# Patient Record
Sex: Female | Born: 1975 | Race: Black or African American | Hispanic: No | Marital: Single | Smoking: Never smoker
Health system: Southern US, Community
[De-identification: ages and names within clinical notes are randomized; demographics above are authoritative.]

## PROBLEM LIST (undated history)

## (undated) DIAGNOSIS — K219 Gastro-esophageal reflux disease without esophagitis: Secondary | ICD-10-CM

## (undated) DIAGNOSIS — B20 Human immunodeficiency virus [HIV] disease: Secondary | ICD-10-CM

## (undated) HISTORY — PX: OVARIAN CYST SURGERY: SHX726

## (undated) HISTORY — PX: OOPHORECTOMY: SHX86

---

## 2014-03-11 ENCOUNTER — Ambulatory Visit: Payer: Self-pay | Admitting: Advanced Practice Midwife

## 2014-04-18 ENCOUNTER — Emergency Department (HOSPITAL_BASED_OUTPATIENT_CLINIC_OR_DEPARTMENT_OTHER): Payer: Medicaid Other

## 2014-04-18 ENCOUNTER — Encounter (HOSPITAL_BASED_OUTPATIENT_CLINIC_OR_DEPARTMENT_OTHER): Payer: Self-pay | Admitting: Emergency Medicine

## 2014-04-18 ENCOUNTER — Emergency Department (HOSPITAL_BASED_OUTPATIENT_CLINIC_OR_DEPARTMENT_OTHER)
Admission: EM | Admit: 2014-04-18 | Discharge: 2014-04-19 | Disposition: A | Discharge status: Home / Self Care | Payer: Medicaid Other | Attending: Emergency Medicine | Admitting: Emergency Medicine

## 2014-04-18 DIAGNOSIS — R143 Flatulence: Principal | ICD-10-CM

## 2014-04-18 DIAGNOSIS — Z79899 Other long term (current) drug therapy: Secondary | ICD-10-CM | POA: Diagnosis not present

## 2014-04-18 DIAGNOSIS — Z21 Asymptomatic human immunodeficiency virus [HIV] infection status: Secondary | ICD-10-CM | POA: Insufficient documentation

## 2014-04-18 DIAGNOSIS — R1084 Generalized abdominal pain: Secondary | ICD-10-CM | POA: Insufficient documentation

## 2014-04-18 DIAGNOSIS — R141 Gas pain: Secondary | ICD-10-CM | POA: Diagnosis not present

## 2014-04-18 DIAGNOSIS — Z3202 Encounter for pregnancy test, result negative: Secondary | ICD-10-CM | POA: Insufficient documentation

## 2014-04-18 DIAGNOSIS — K219 Gastro-esophageal reflux disease without esophagitis: Secondary | ICD-10-CM | POA: Insufficient documentation

## 2014-04-18 DIAGNOSIS — R142 Eructation: Principal | ICD-10-CM

## 2014-04-18 DIAGNOSIS — IMO0001 Reserved for inherently not codable concepts without codable children: Secondary | ICD-10-CM

## 2014-04-18 HISTORY — DX: Gastro-esophageal reflux disease without esophagitis: K21.9

## 2014-04-18 HISTORY — DX: Human immunodeficiency virus (HIV) disease: B20

## 2014-04-18 LAB — URINALYSIS, ROUTINE W REFLEX MICROSCOPIC
Bilirubin Urine: NEGATIVE
Glucose, UA: NEGATIVE mg/dL
HGB URINE DIPSTICK: NEGATIVE
Ketones, ur: NEGATIVE mg/dL
Leukocytes, UA: NEGATIVE
Nitrite: NEGATIVE
Protein, ur: NEGATIVE mg/dL
SPECIFIC GRAVITY, URINE: 1.027 (ref 1.005–1.030)
UROBILINOGEN UA: 0.2 mg/dL (ref 0.0–1.0)
pH: 5.5 (ref 5.0–8.0)

## 2014-04-18 LAB — PREGNANCY, URINE: PREG TEST UR: NEGATIVE

## 2014-04-18 MED ORDER — DICYCLOMINE HCL 10 MG PO CAPS
10.0000 mg | ORAL_CAPSULE | Freq: Once | ORAL | Status: AC
  Administered 2014-04-18: 10 mg via ORAL
  Filled 2014-04-18: qty 1

## 2014-04-18 MED ORDER — GI COCKTAIL ~~LOC~~
30.0000 mL | Freq: Once | ORAL | Status: AC
  Administered 2014-04-18: 30 mL via ORAL
  Filled 2014-04-18: qty 30

## 2014-04-18 NOTE — ED Notes (Signed)
Patient transported to X-ray 

## 2014-04-18 NOTE — ED Notes (Signed)
Pt states unable to [place] kit for ED WR

## 2014-04-18 NOTE — ED Notes (Signed)
MD at bedside. 

## 2014-04-18 NOTE — ED Provider Notes (Signed)
CSN: [id number]     Arrival date & time 04/18/14  2133 History  This chart was scribed for Hanne Kegg Smitty Cords, MD by Charline Bills, ED Scribe. The patient was seen in room MH05/MH05. Patient's care was started at 11:08 PM.   Chief Complaint  Patient presents with  . Abdominal Pain   Patient is a 38 y.o. female presenting with abdominal pain. The history is provided by the patient. No language interpreter was used.  Abdominal Pain Pain location:  Generalized Pain quality: bloating and cramping   Pain radiates to:  Does not radiate Pain severity:  Moderate Onset quality:  Sudden Duration:  1 hour Timing:  Intermittent Progression:  Worsening Chronicity:  New Context: eating   Context comment:  Mac and chees with Pinto beans within an hour or so of start Relieved by:  Nothing Worsened by:  Nothing tried Ineffective treatments:  None tried Associated symptoms: belching and flatus   Associated symptoms: no chest pain, no chills, no constipation, no diarrhea, no dysuria, no fever, no hematuria, no nausea, no shortness of breath, no vaginal bleeding and no vomiting   Risk factors: no alcohol abuse    HPI Comments: Brandy Patton is a 38 y.o. female, with a h/o GERD and HIV, who presents to the Emergency Department complaining of intermittent lower abdominal pain onset 1 hour PTA. Pt states that it "feels like trapped gas". Pt states that she ate pinto beans and macaroni and cheese prior to pain. She denies diarrhea, constipation, hematuria, dysuria.   Past Medical History  Diagnosis Date  . GERD (gastroesophageal reflux disease)   . HIV (human immunodeficiency virus infection)    Past Surgical History  Procedure Laterality Date  . Oophorectomy    . Ovarian cyst surgery     No family history on file. History  Substance Use Topics  . Smoking status: Never Smoker   . Smokeless tobacco: Not on file  . Alcohol Use: No   OB History   Grav Para Term Preterm Abortions TAB SAB Ect  Mult Living                 Review of Systems  Constitutional: Negative for fever and chills.  Respiratory: Negative for shortness of breath.   Cardiovascular: Negative for chest pain.  Gastrointestinal: Positive for abdominal pain and flatus. Negative for nausea, vomiting, diarrhea and constipation.  Genitourinary: Negative for dysuria, hematuria and vaginal bleeding.  All other systems reviewed and are negative.  Allergies  Review of patient's allergies indicates no known allergies.  Home Medications   Prior to Admission medications   Medication Sig Start Date End Date Taking? Authorizing Provider  AMITRIPTYLINE HCL PO Take by mouth.   Yes Historical Provider, MD  Emtricitabine-Tenofovir (TRUVADA PO) Take by mouth.   Yes Historical Provider, MD  OMEPRAZOLE PO Take by mouth.   Yes Historical Provider, MD  raltegravir (ISENTRESS) 400 MG tablet Take 400 mg by mouth 2 (two) times daily.   Yes Historical Provider, MD  SERTRALINE HCL PO Take by mouth.   Yes Historical Provider, MD   Triage Vitals: BP 135/90  Pulse 84  Temp(Src) 98.3 F (36.8 C) (Oral)  Resp 22  Ht  (1.575 m)  Wt 240 lb (108.863 kg)  BMI 43.89 kg/m2  SpO2 99%  LMP 03/27/2014 Physical Exam  Nursing note and vitals reviewed. Constitutional: She is oriented to person, place, and time. She appears well-developed and well-nourished. No distress.  HENT:  Head: Normocephalic and atraumatic.  Mouth/Throat: Oropharynx is clear and moist.  Eyes: Conjunctivae and EOM are normal. Pupils are equal, round, and reactive to light.  Neck: Normal range of motion. Neck supple. No tracheal deviation present.  Cardiovascular: Normal rate and regular rhythm.   Pulmonary/Chest: Effort normal and breath sounds normal. No respiratory distress. She has no wheezes. She has no rales.  Abdominal: Soft. She exhibits distension. Bowel sounds are increased. There is no tenderness. There is no rigidity, no rebound, no guarding, no  tenderness at McBurney's point and negative Murphy's sign.  Audible gas without stethoscope  Musculoskeletal: Normal range of motion.  Neurological: She is alert and oriented to person, place, and time.  Skin: Skin is warm and dry.  Psychiatric: She has a normal mood and affect. Her behavior is normal.   ED Course  Procedures (including critical care time) DIAGNOSTIC STUDIES: Oxygen Saturation is 99% on RA, normal by my interpretation.    COORDINATION OF CARE: 11:13 PM-Discussed treatment plan which includes UA with pt at bedside and pt agreed to plan.   Labs Review Labs Reviewed  URINALYSIS, ROUTINE W REFLEX MICROSCOPIC - Abnormal; Notable for the following:    APPearance CLOUDY (*)    All other components within normal limits  PREGNANCY, URINE   Imaging Review Dg Abd Acute W/chest  04/18/2014   CLINICAL DATA:  Stomach pain after eating pinto beans this evening.  EXAM: ACUTE ABDOMEN SERIES (ABDOMEN 2 VIEW & CHEST 1 VIEW)  COMPARISON:  None.  FINDINGS: Normal heart size and pulmonary vascularity. No focal airspace disease or consolidation in the lungs. No blunting of costophrenic angles. No pneumothorax. Mediastinal contours appear intact.  Gas and stool throughout the colon. Gas with and left upper quadrant small bowel. No small or large bowel distention. No free intra-abdominal air. No abnormal air-fluid levels. No radiopaque stones. Degenerative changes in the lumbar spine and hips.  IMPRESSION: No evidence of active pulmonary disease. Nonobstructive bowel gas pattern.   Electronically Signed   By: Burman Nieves M.D.   On: 04/18/2014 23:44    EKG Interpretation None      MDM   Final diagnoses:  None   Toradol injection Bentyl and GI cocktail given with resolution of belching and upper abdominal discomfort.  Recommend  glycerin suppository to stimulate movement from below and staying away from foods that can can cause gas such as beans and follow-up with family physician.     I personally performed the services described in this documentation, which was scribed in my presence. The recorded information has been reviewed and is accurate.    Jasmine Awe, MD 04/19/14 [id number]

## 2014-04-18 NOTE — ED Notes (Signed)
C/o abd pain x 1 hour-"feels like trapped gas"-pt states she was taken to [facility] ED via EMS-states she did left due to being placed in ED lobby with no privacy

## 2014-04-19 ENCOUNTER — Encounter (HOSPITAL_BASED_OUTPATIENT_CLINIC_OR_DEPARTMENT_OTHER): Payer: Self-pay | Admitting: Emergency Medicine

## 2014-04-19 MED ORDER — KETOROLAC TROMETHAMINE 60 MG/2ML IM SOLN
60.0000 mg | Freq: Once | INTRAMUSCULAR | Status: AC
  Administered 2014-04-19: 60 mg via INTRAMUSCULAR

## 2014-04-19 MED ORDER — DICYCLOMINE HCL 20 MG PO TABS: 20.0000 mg | ORAL_TABLET | Freq: Two times a day (BID) | ORAL | Status: AC

## 2014-04-19 MED ORDER — KETOROLAC TROMETHAMINE 60 MG/2ML IM SOLN
INTRAMUSCULAR | Status: AC
  Filled 2014-04-19: qty 2

## 2014-04-19 NOTE — Discharge Instructions (Signed)
Bloating Bloating is the feeling of fullness in your belly. You may feel as though your pants are too tight. Often the cause of bloating is overeating, retaining fluids, or having gas in your bowel. It is also caused by swallowing air and eating foods that cause gas. Irritable bowel syndrome is one of the most common causes of bloating. Constipation is also a common cause. Sometimes more serious problems can cause bloating. SYMPTOMS  Usually there is a feeling of fullness, as though your abdomen is bulged out. There may be mild discomfort.  DIAGNOSIS  Usually no particular testing is necessary for most bloating. If the condition persists and seems to become worse, your caregiver may do additional testing.  TREATMENT   There is no direct treatment for bloating.  Do not put gas into the bowel. Avoid chewing gum and sucking on candy. These tend to make you swallow air. Swallowing air can also be a nervous habit. Try to avoid this.  Avoiding high residue diets will help. Eat foods with soluble fibers (examples include root vegetables, apples, or barley) and substitute dairy products with soy and rice products. This helps irritable bowel syndrome.  If constipation is the cause, then a high residue diet with more fiber will help.  Avoid carbonated beverages.  Over-the-counter preparations are available that help reduce gas. Your pharmacist can help you with this. SEEK MEDICAL CARE IF:   Bloating continues and seems to be getting worse.  You notice a weight gain.  You have a weight loss but the bloating is getting worse.  You have changes in your bowel habits or develop nausea or vomiting. SEEK IMMEDIATE MEDICAL CARE IF:   You develop shortness of breath or swelling in your legs.  You have an increase in abdominal pain or develop chest pain. Document Released: 06/12/2006 Document Revised: 11/04/2011 Document Reviewed: 07/31/2007 ExitCare Patient Information 2015 [employer]. This  information is not intended to replace advice given to you by your health care provider. Make sure you discuss any questions you have with your health care provider.  

## 2014-04-19 NOTE — ED Notes (Signed)
MD at bedside discussing test results and dispo plan of care. 

## 2014-05-03 ENCOUNTER — Ambulatory Visit: Payer: Self-pay | Admitting: Obstetrics

## 2014-06-21 ENCOUNTER — Ambulatory Visit: Payer: Self-pay | Admitting: Obstetrics

## 2015-09-23 IMAGING — CR DG ABDOMEN ACUTE W/ 1V CHEST
4 series · 4 of 4 positions shown · non-contrast
Comparison: None.

CLINICAL DATA: Stomach pain after eating Raffaele Merlos this evening.

EXAM:
ACUTE ABDOMEN SERIES (ABDOMEN 2 VIEW & CHEST 1 VIEW)

[w chest pa]
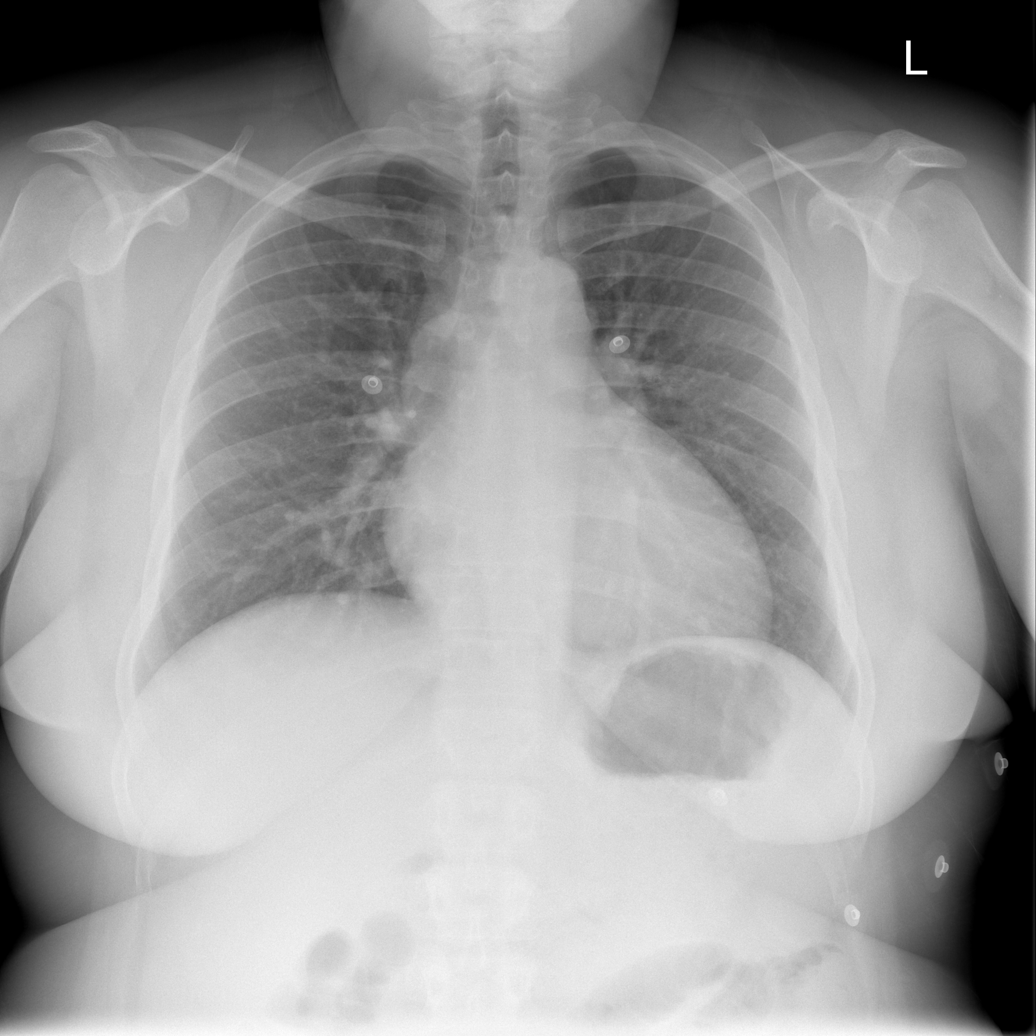

[w abdomen upright]
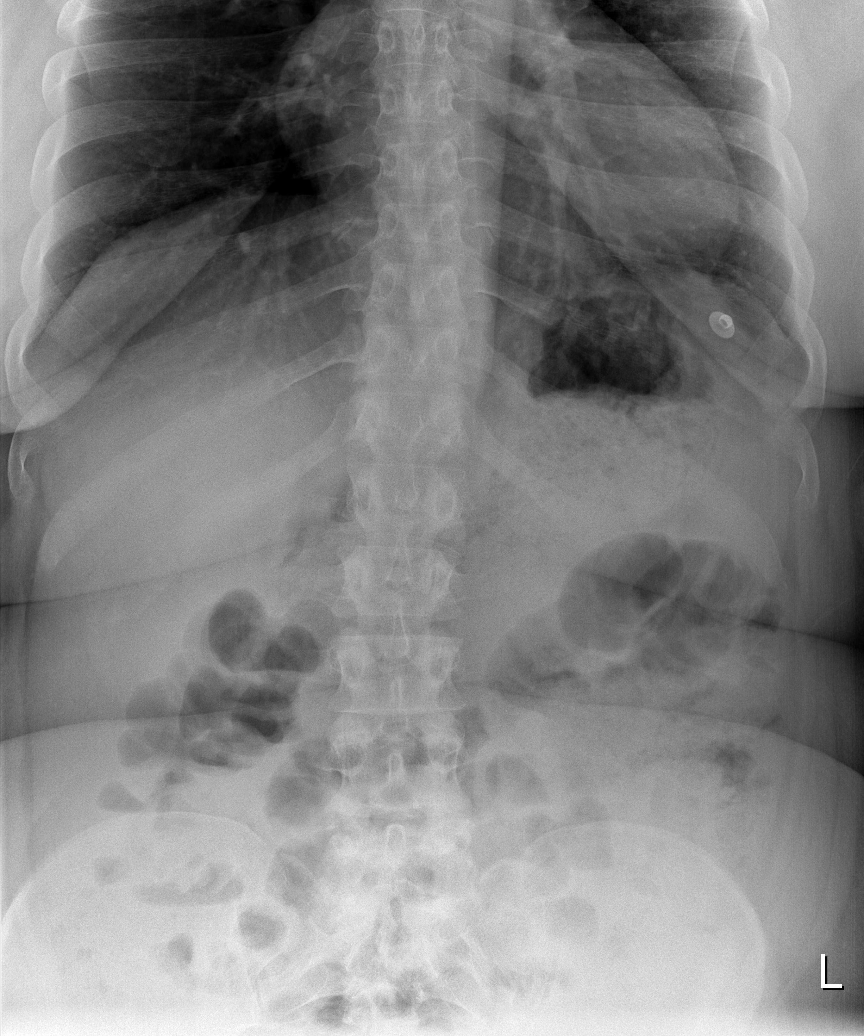

[t abdomen supine (1 of 2)]
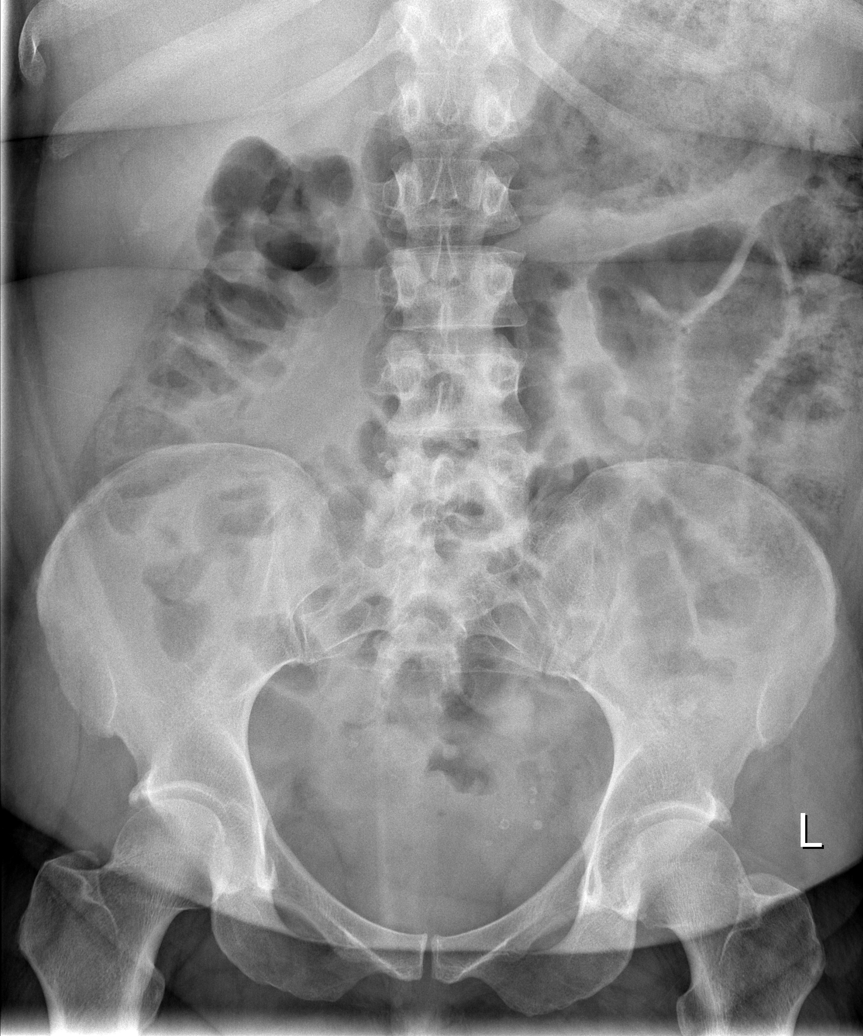

[t abdomen supine (2 of 2)]
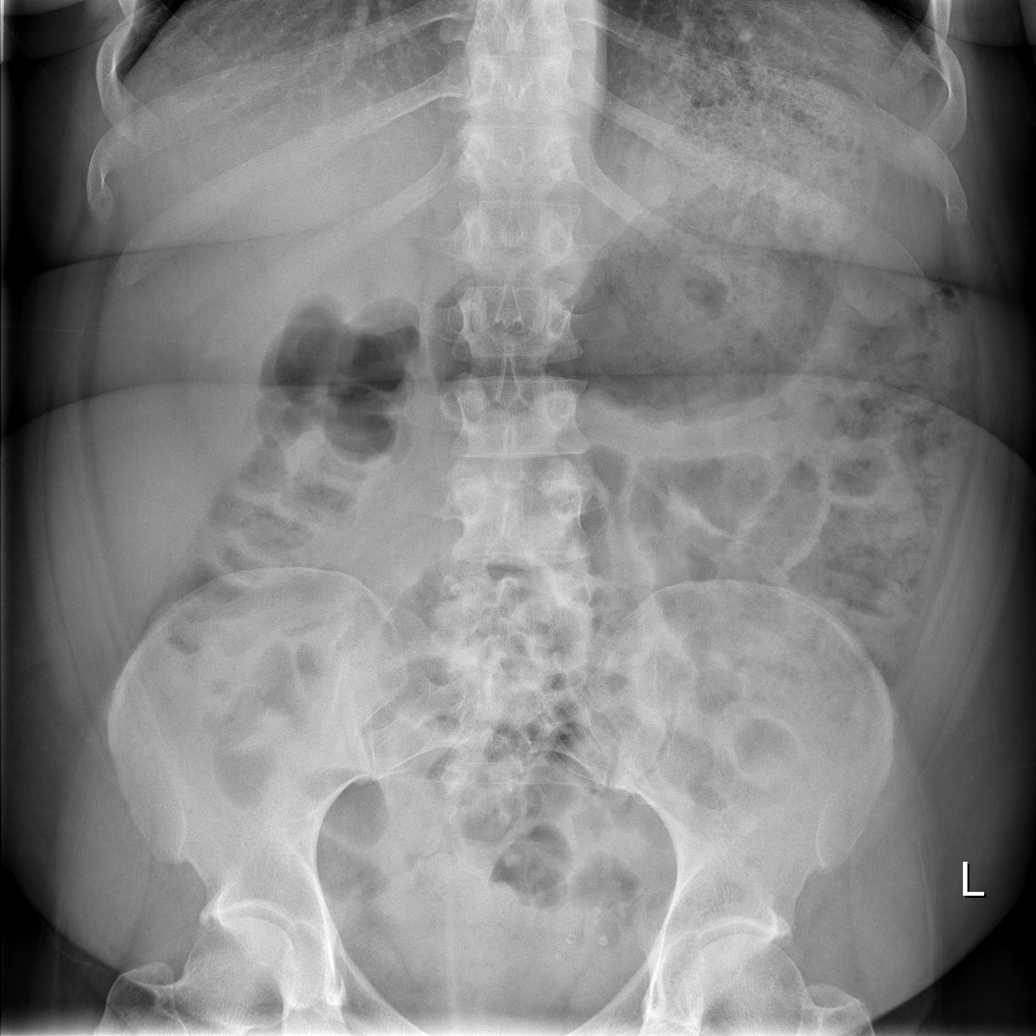

[4 of 4 positions shown; findings below may reference images not displayed]

FINDINGS: Normal heart size and pulmonary vascularity. No focal airspace
disease or consolidation in the lungs. No blunting of costophrenic
angles. No pneumothorax. Mediastinal contours appear intact.

Gas and stool throughout the colon. Gas with and left upper quadrant
small bowel. No small or large bowel distention. No free
intra-abdominal air. No abnormal air-fluid levels. No radiopaque
stones. Degenerative changes in the lumbar spine and hips.
IMPRESSION: No evidence of active pulmonary disease. Nonobstructive bowel gas
pattern.

## 2017-08-27 ENCOUNTER — Emergency Department (HOSPITAL_BASED_OUTPATIENT_CLINIC_OR_DEPARTMENT_OTHER)
Admission: EM | Admit: 2017-08-27 | Discharge: 2017-08-27 | Disposition: A | Discharge status: Home / Self Care | Payer: Medicaid Other | Attending: Emergency Medicine | Admitting: Emergency Medicine

## 2017-08-27 ENCOUNTER — Other Ambulatory Visit: Payer: Self-pay

## 2017-08-27 ENCOUNTER — Encounter (HOSPITAL_BASED_OUTPATIENT_CLINIC_OR_DEPARTMENT_OTHER): Payer: Self-pay | Admitting: Emergency Medicine

## 2017-08-27 DIAGNOSIS — A5901 Trichomonal vulvovaginitis: Secondary | ICD-10-CM | POA: Diagnosis not present

## 2017-08-27 DIAGNOSIS — A599 Trichomoniasis, unspecified: Secondary | ICD-10-CM

## 2017-08-27 DIAGNOSIS — Z21 Asymptomatic human immunodeficiency virus [HIV] infection status: Secondary | ICD-10-CM | POA: Insufficient documentation

## 2017-08-27 DIAGNOSIS — O98711 Human immunodeficiency virus [HIV] disease complicating pregnancy, first trimester: Secondary | ICD-10-CM | POA: Diagnosis not present

## 2017-08-27 DIAGNOSIS — O219 Vomiting of pregnancy, unspecified: Secondary | ICD-10-CM

## 2017-08-27 DIAGNOSIS — O218 Other vomiting complicating pregnancy: Secondary | ICD-10-CM | POA: Diagnosis not present

## 2017-08-27 DIAGNOSIS — Z3A12 12 weeks gestation of pregnancy: Secondary | ICD-10-CM | POA: Diagnosis not present

## 2017-08-27 DIAGNOSIS — O2310 Infections of bladder in pregnancy, unspecified trimester: Secondary | ICD-10-CM | POA: Insufficient documentation

## 2017-08-27 DIAGNOSIS — N3000 Acute cystitis without hematuria: Secondary | ICD-10-CM

## 2017-08-27 DIAGNOSIS — O98311 Other infections with a predominantly sexual mode of transmission complicating pregnancy, first trimester: Secondary | ICD-10-CM | POA: Diagnosis not present

## 2017-08-27 DIAGNOSIS — Z79899 Other long term (current) drug therapy: Secondary | ICD-10-CM | POA: Diagnosis not present

## 2017-08-27 LAB — URINALYSIS, ROUTINE W REFLEX MICROSCOPIC
BILIRUBIN URINE: NEGATIVE
Glucose, UA: NEGATIVE mg/dL
Ketones, ur: >80 mg/dL — AB
Nitrite: NEGATIVE
PH: 6 (ref 5.0–8.0)
Protein, ur: NEGATIVE mg/dL
SPECIFIC GRAVITY, URINE: 1.025 (ref 1.005–1.030)

## 2017-08-27 LAB — URINALYSIS, MICROSCOPIC (REFLEX)

## 2017-08-27 LAB — COMPREHENSIVE METABOLIC PANEL
ALK PHOS: 32 U/L — AB (ref 38–126)
ALT: 17 U/L (ref 14–54)
ANION GAP: 7 (ref 5–15)
AST: 15 U/L (ref 15–41)
Albumin: 3.4 g/dL — ABNORMAL LOW (ref 3.5–5.0)
BILIRUBIN TOTAL: 0.4 mg/dL (ref 0.3–1.2)
BUN: <5 mg/dL — ABNORMAL LOW (ref 6–20)
CALCIUM: 8.8 mg/dL — AB (ref 8.9–10.3)
CO2: 21 mmol/L — AB (ref 22–32)
CREATININE: 0.42 mg/dL — AB (ref 0.44–1.00)
Chloride: 104 mmol/L (ref 101–111)
GFR calc Af Amer: >60 mL/min (ref >60)
GFR calc non Af Amer: >60 mL/min (ref >60)
Glucose, Bld: 95 mg/dL (ref 65–99)
Potassium: 3.1 mmol/L — ABNORMAL LOW (ref 3.5–5.1)
SODIUM: 132 mmol/L — AB (ref 135–145)
Total Protein: 8.4 g/dL — ABNORMAL HIGH (ref 6.5–8.1)

## 2017-08-27 MED ORDER — NITROFURANTOIN MONOHYD MACRO 100 MG PO CAPS: 100.0000 mg | ORAL_CAPSULE | Freq: Two times a day (BID) | ORAL | 0 refills | Status: AC

## 2017-08-27 MED ORDER — SODIUM CHLORIDE 0.9 % IV BOLUS (SEPSIS)
1000.0000 mL | Freq: Once | INTRAVENOUS | Status: AC
  Administered 2017-08-27: 1000 mL via INTRAVENOUS

## 2017-08-27 MED ORDER — METOCLOPRAMIDE HCL 5 MG/ML IJ SOLN
5.0000 mg | Freq: Once | INTRAMUSCULAR | Status: AC
  Administered 2017-08-27: 5 mg via INTRAVENOUS
  Filled 2017-08-27: qty 2

## 2017-08-27 MED ORDER — DIPHENHYDRAMINE HCL 50 MG/ML IJ SOLN
12.5000 mg | Freq: Once | INTRAMUSCULAR | Status: AC
  Administered 2017-08-27: 12.5 mg via INTRAVENOUS
  Filled 2017-08-27: qty 1

## 2017-08-27 MED ORDER — NITROFURANTOIN MONOHYD MACRO 100 MG PO CAPS
100.0000 mg | ORAL_CAPSULE | Freq: Once | ORAL | Status: AC
  Administered 2017-08-27: 100 mg via ORAL
  Filled 2017-08-27: qty 1

## 2017-08-27 MED ORDER — DOXYLAMINE-PYRIDOXINE 10-10 MG PO TBEC: 2.0000 | DELAYED_RELEASE_TABLET | Freq: Every day | ORAL | 0 refills | Status: AC

## 2017-08-27 MED ORDER — METRONIDAZOLE 500 MG PO TABS
2000.0000 mg | ORAL_TABLET | Freq: Once | ORAL | Status: AC
  Administered 2017-08-27: 2000 mg via ORAL
  Filled 2017-08-27: qty 4

## 2017-08-27 NOTE — ED Provider Notes (Addendum)
[facility] EMERGENCY DEPARTMENT Provider Note   CSN: [id number] Arrival date & time: 08/27/17  0010     History   Chief Complaint Chief Complaint  Patient presents with  . Nausea  . Emesis    HPI Brandy Millinndria Garlington is a 42 y.o. female.  The history is provided by the patient.  Emesis   This is a new problem. The current episode started more than 1 week ago. The problem occurs 2 to 4 times per day. The problem has not changed since onset.The emesis has an appearance of stomach contents. There has been no fever. Pertinent negatives include no abdominal pain, no arthralgias, no chills, no cough, no diarrhea, no fever, no headaches, no myalgias, no sweats and no URI. Risk factors: Is first trimester pregnancy and has started on new HIV meds because of the pregnancy was treated for trichomonas on 12/19 but has vomited and not sure if treatment worked.    Has not been sexually active since finding out about the trichomonas.    Past Medical History:  Diagnosis Date  . GERD (gastroesophageal reflux disease)   . HIV (human immunodeficiency virus infection) (HCC)     There are no active problems to display for this patient.   Past Surgical History:  Procedure Laterality Date  . OOPHORECTOMY    . OVARIAN CYST SURGERY      OB History    No data available       Home Medications    Prior to Admission medications   Medication Sig Start Date End Date Taking? Authorizing Provider  atazanavir (REYATAZ) 300 MG capsule Take 300 mg by mouth daily with breakfast.   Yes [provider]  ritonavir (NORVIR) 100 MG TABS tablet Take 100 mg by mouth.   Yes [provider]  AMITRIPTYLINE HCL PO Take by mouth.    [provider]  dicyclomine (BENTYL) 20 MG tablet Take 1 tablet (20 mg total) by mouth 2 (two) times daily. 04/19/14   Aviendha Azbell, MD  Doxylamine-Pyridoxine (DICLEGIS) 10-10 MG TBEC Take 2 tablets by mouth at bedtime. Take on an empty stomach 08/27/17    Lipa Knauff, MD  Emtricitabine-Tenofovir (TRUVADA PO) Take by mouth.    [provider]  nitrofurantoin, macrocrystal-monohydrate, (MACROBID) 100 MG capsule Take 1 capsule (100 mg total) by mouth 2 (two) times daily. X 7 days 08/27/17   Eron Staat, MD  OMEPRAZOLE PO Take by mouth.    [provider]  raltegravir (ISENTRESS) 400 MG tablet Take 400 mg by mouth 2 (two) times daily.    [provider]  SERTRALINE HCL PO Take by mouth.    [provider]    Family History No family history on file.  Social History Social History   Tobacco Use  . Smoking status: Never Smoker  Substance Use Topics  . Alcohol use: No  . Drug use: No     Allergies   Patient has no known allergies.   Review of Systems Review of Systems  Constitutional: Negative for chills and fever.  Respiratory: Negative for cough.   Cardiovascular: Negative for chest pain.  Gastrointestinal: Positive for nausea and vomiting. Negative for abdominal pain, constipation and diarrhea.  Genitourinary: Negative for dysuria, genital sores, pelvic pain and vaginal discharge.  Musculoskeletal: Negative for arthralgias and myalgias.  Neurological: Negative for headaches.  All other systems reviewed and are negative.    Physical Exam Updated Vital Signs BP 134/82 (BP Location: Left Arm)   Pulse 79  Temp 98.3 F (36.8 C) (Oral)   Resp 18   LMP 05/28/2017   SpO2 100%   Physical Exam  Constitutional: She is oriented to person, place, and time. She appears well-developed and well-nourished. No distress.  HENT:  Head: Normocephalic and atraumatic.  Mouth/Throat: No oropharyngeal exudate.  Eyes: Conjunctivae are normal. Pupils are equal, round, and reactive to light.  Neck: Normal range of motion. Neck supple.  Cardiovascular: Normal rate, regular rhythm, normal heart sounds and intact distal pulses.  Pulmonary/Chest: Effort normal and breath sounds normal. No stridor. No  respiratory distress. She has no wheezes. She has no rales.  Abdominal: Soft. Bowel sounds are normal. She exhibits no mass. There is no tenderness. There is no guarding.  Musculoskeletal: Normal range of motion.  Neurological: She is alert and oriented to person, place, and time. She displays normal reflexes.  Skin: Skin is warm and dry. Capillary refill takes less than 2 seconds.  Psychiatric: She has a normal mood and affect.     ED Treatments / Results  Labs (all labs ordered are listed, but only abnormal results are displayed)  Results for orders placed or performed during the hospital encounter of 08/27/17  Urinalysis, Routine w reflex microscopic  Result Value Ref Range   Color, Urine YELLOW YELLOW   APPearance TURBID (A) CLEAR   Specific Gravity, Urine 1.025 1.005 - 1.030   pH 6.0 5.0 - 8.0   Glucose, UA NEGATIVE NEGATIVE mg/dL   Hgb urine dipstick SMALL (A) NEGATIVE   Bilirubin Urine NEGATIVE NEGATIVE   Ketones, ur >80 (A) NEGATIVE mg/dL   Protein, ur NEGATIVE NEGATIVE mg/dL   Nitrite NEGATIVE NEGATIVE   Leukocytes, UA LARGE (A) NEGATIVE  Comprehensive metabolic panel  Result Value Ref Range   Sodium 132 (L) 135 - 145 mmol/L   Potassium 3.1 (L) 3.5 - 5.1 mmol/L   Chloride 104 101 - 111 mmol/L   CO2 21 (L) 22 - 32 mmol/L   Glucose, Bld 95 65 - 99 mg/dL   BUN <5 (L) 6 - 20 mg/dL   Creatinine, Ser 1.61 (L) 0.44 - 1.00 mg/dL   Calcium 8.8 (L) 8.9 - 10.3 mg/dL   Total Protein 8.4 (H) 6.5 - 8.1 g/dL   Albumin 3.4 (L) 3.5 - 5.0 g/dL   AST 15 15 - 41 U/L   ALT 17 14 - 54 U/L   Alkaline Phosphatase 32 (L) 38 - 126 U/L   Total Bilirubin 0.4 0.3 - 1.2 mg/dL   GFR calc non Af Amer >60 >60 mL/min   GFR calc Af Amer >60 >60 mL/min   Anion gap 7 5 - 15  Urinalysis, Microscopic (reflex)  Result Value Ref Range   RBC / HPF 6-30 0 - 5 RBC/hpf   WBC, UA 6-30 0 - 5 WBC/hpf   Bacteria, UA MANY (A) NONE SEEN   Squamous Epithelial / LPF 6-30 (A) NONE SEEN   Trichomonas, UA  PRESENT    No results found.   Procedures Procedures (including critical care time)  Medications Ordered in ED Medications  sodium chloride 0.9 % bolus 1,000 mL (0 mLs Intravenous Stopped 08/27/17 0307)  metoCLOPramide (REGLAN) injection 5 mg (5 mg Intravenous Given 08/27/17 0357)  diphenhydrAMINE (BENADRYL) injection 12.5 mg (12.5 mg Intravenous Given 08/27/17 0358)  metroNIDAZOLE (FLAGYL) tablet 2,000 mg (2,000 mg Oral Given 08/27/17 0445)  nitrofurantoin (macrocrystal-monohydrate) (MACROBID) capsule 100 mg (100 mg Oral Given 08/27/17 0445)     PO challenged successfully in the ED  Final Clinical Impressions(s) / ED Diagnoses   Final diagnoses:  Nausea and vomiting in pregnancy prior to [redacted] weeks gestation  Trichomoniasis  Acute cystitis without hematuria   GC and chlamydia were negative at [facility]. Will start diclegis and macrobid for asymptomatic bacteruria.  Follow up with your OB this week and [facility] for further treatment.  No sexual activity of any kind until 7 days after all partners treated.    Return for urinary or vaginal symptoms, fevers > 100.4 unrelieved by medication,  intractable vomiting, or diarrhea, abdominal pain, Inability to tolerate liquids or food, cough, altered mental status or any concerns. No signs of systemic illness or infection. The patient is nontoxic-appearing on exam and vital signs are within normal limits.    I have reviewed the triage vital signs and the nursing notes. Pertinent labs &imaging results that were available during my care of the patient were reviewed by me and considered in my medical decision making (see chart for details).  After history, exam, and medical workup I feel the patient has been appropriately medically screened and is safe for discharge home. Pertinent diagnoses were discussed with the patient. Patient was given return precautions  ED Discharge Orders        Ordered    nitrofurantoin, macrocrystal-monohydrate, (MACROBID) 100  MG capsule  2 times daily     08/27/17 0459    Doxylamine-Pyridoxine (DICLEGIS) 10-10 MG TBEC  Daily at bedtime     08/27/17 0459       Tabb Croghan, MD 08/27/17 2956    Nicanor Alcon, Teretha Chalupa, MD 08/27/17 2130

## 2017-08-27 NOTE — ED Triage Notes (Signed)
PT presents with c/o n/v and [redacted] weeks pregnant. Pt recently started new meds for HIV. Norvir, reyataz, truvada.

## 2017-08-27 NOTE — ED Notes (Signed)
ED Provider at bedside. 

## 2018-09-03 ENCOUNTER — Encounter (HOSPITAL_BASED_OUTPATIENT_CLINIC_OR_DEPARTMENT_OTHER): Payer: Self-pay

## 2018-09-03 ENCOUNTER — Emergency Department (HOSPITAL_BASED_OUTPATIENT_CLINIC_OR_DEPARTMENT_OTHER)
Admission: EM | Admit: 2018-09-03 | Discharge: 2018-09-03 | Disposition: A | Discharge status: Home / Self Care | Payer: Medicaid Other | Attending: Emergency Medicine | Admitting: Emergency Medicine

## 2018-09-03 ENCOUNTER — Other Ambulatory Visit: Payer: Self-pay

## 2018-09-03 DIAGNOSIS — J111 Influenza due to unidentified influenza virus with other respiratory manifestations: Secondary | ICD-10-CM | POA: Diagnosis not present

## 2018-09-03 DIAGNOSIS — R6889 Other general symptoms and signs: Secondary | ICD-10-CM

## 2018-09-03 DIAGNOSIS — Z79899 Other long term (current) drug therapy: Secondary | ICD-10-CM | POA: Insufficient documentation

## 2018-09-03 DIAGNOSIS — R0981 Nasal congestion: Secondary | ICD-10-CM | POA: Diagnosis present

## 2018-09-03 MED ORDER — OSELTAMIVIR PHOSPHATE 75 MG PO CAPS: 75.0000 mg | ORAL_CAPSULE | Freq: Two times a day (BID) | ORAL | 0 refills | Status: AC

## 2018-09-03 MED FILL — TAMIFLU 75 MG GELCAP: 75 | 5 days supply | Qty: 10 | Fill #0

## 2018-09-03 NOTE — ED Triage Notes (Signed)
Pt has congestion, eyes watering, cough and fevers x 2 days. Son being treated for the same.

## 2018-09-03 NOTE — ED Provider Notes (Signed)
[facility] EMERGENCY DEPARTMENT Provider Note   CSN: [id number] Arrival date & time: 09/03/18  1335   History   Chief Complaint Chief Complaint  Patient presents with  . URI    HPI Brandy Patton is a 43 y.o. female with past medical history significant for HIV who presents for evaluation of nasal congestion, rhinorrhea, nonproductive cough, body aches and pains and subjective fevers onset yesterday.  Patient states her son was diagnosed yesterday with influenza B.  Patient states she did not receive her influenza vaccine.  Has had multiple sick contacts.  Patient states she is current on her HIV medications and has not missed any of her doses.  Denies chills, sore throat, chest pain, shortness of breath, headache, nausea, vomiting, productive cough, abdominal pain, dysuria, diarrhea.  Has not taken anything for her symptoms PTA.  Denies additional aggravating or alleviating factors.  Symptoms have been constant in nature.  History provided by patient.  No interpreter was used.  HPI  Past Medical History:  Diagnosis Date  . GERD (gastroesophageal reflux disease)   . HIV (human immunodeficiency virus infection) (HCC)     There are no active problems to display for this patient.   Past Surgical History:  Procedure Laterality Date  . CESAREAN SECTION    . OOPHORECTOMY    . OVARIAN CYST SURGERY       OB History   No obstetric history on file.      Home Medications    Prior to Admission medications   Medication Sig Start Date End Date Taking? Authorizing Provider  OMEPRAZOLE PO Take by mouth.   Yes [provider]  AMITRIPTYLINE HCL PO Take by mouth.    [provider]  atazanavir (REYATAZ) 300 MG capsule Take 300 mg by mouth daily with breakfast.    [provider]  dicyclomine (BENTYL) 20 MG tablet Take 1 tablet (20 mg total) by mouth 2 (two) times daily. 04/19/14   Palumbo, April, MD  Doxylamine-Pyridoxine (DICLEGIS) 10-10 MG TBEC  Take 2 tablets by mouth at bedtime. Take on an empty stomach 08/27/17   Palumbo, April, MD  Emtricitabine-Tenofovir (TRUVADA PO) Take by mouth.    [provider]  nitrofurantoin, macrocrystal-monohydrate, (MACROBID) 100 MG capsule Take 1 capsule (100 mg total) by mouth 2 (two) times daily. X 7 days 08/27/17   Nicanor Alcon, April, MD  oseltamivir (TAMIFLU) 75 MG capsule Take 1 capsule (75 mg total) by mouth every 12 (twelve) hours. 09/03/18   Allaya Abbasi A, PA-C  raltegravir (ISENTRESS) 400 MG tablet Take 400 mg by mouth 2 (two) times daily.    [provider]  ritonavir (NORVIR) 100 MG TABS tablet Take 100 mg by mouth.    [provider]  SERTRALINE HCL PO Take by mouth.    [provider]    Family History No family history on file.  Social History Social History   Tobacco Use  . Smoking status: Never Smoker  Substance Use Topics  . Alcohol use: No  . Drug use: No     Allergies   Patient has no known allergies.   Review of Systems Review of Systems  Constitutional: Negative for activity change, appetite change, chills, diaphoresis, fatigue and unexpected weight change.       Subjective fever.  HENT: Positive for congestion, postnasal drip, rhinorrhea and sneezing. Negative for drooling, ear discharge, ear pain, facial swelling, hearing loss, nosebleeds, sinus pressure, sinus pain, sore throat, tinnitus, trouble swallowing and voice change.  Eyes: Negative for photophobia, pain, redness and visual disturbance.       Bilateral watery eye discharge.  Respiratory: Positive for cough. Negative for choking, chest tightness, shortness of breath, wheezing and stridor.   Cardiovascular: Negative.   Gastrointestinal: Negative.   Genitourinary: Negative.   Musculoskeletal: Negative.   Skin: Negative.   Neurological: Negative.   All other systems reviewed and are negative.    Physical Exam Updated Vital Signs BP 131/86   Pulse 92   Temp 99 F  (37.2 C) (Oral)   Resp 18   Ht 5\' 2"  (1.575 m)   Wt 90.7 kg   LMP 08/05/2018   SpO2 99%   BMI 36.58 kg/m   Physical Exam Vitals signs and nursing note reviewed.  Constitutional:      General: She is not in acute distress.    Appearance: She is well-developed. She is not ill-appearing, toxic-appearing or diaphoretic.     Comments: Patient sitting in bed talking on phone initial evaluation.  No acute distress.  HENT:     Head: Normocephalic and atraumatic.     Right Ear: Tympanic membrane, ear canal and external ear normal. There is no impacted cerumen. Tympanic membrane is not injected, scarred, perforated, erythematous, retracted or bulging.     Left Ear: Tympanic membrane, ear canal and external ear normal. There is no impacted cerumen. Tympanic membrane is not injected, scarred, perforated, erythematous, retracted or bulging.     Nose: Congestion and rhinorrhea present.     Right Sinus: No maxillary sinus tenderness or frontal sinus tenderness.     Left Sinus: No maxillary sinus tenderness or frontal sinus tenderness.     Comments: Clear rhinorrhea to bilateral nares.  No sinus tenderness.    Mouth/Throat:     Lips: Pink. No lesions.     Mouth: Mucous membranes are moist.     Pharynx: Oropharynx is clear. Uvula midline.     Tonsils: No tonsillar exudate or tonsillar abscesses. Swelling: 0 on the right. 0 on the left.     Comments: Posterior pharynx without erythema or exudate.  Uvula midline without deviation.  Tonsils without edema.  No evidence of PTA or RPA. Eyes:     Pupils: Pupils are equal, round, and reactive to light.  Neck:     Musculoskeletal: Full passive range of motion without pain and normal range of motion.     Trachea: Trachea and phonation normal.     Comments: No neck stiffness or neck rigidity.  Phonation normal.  No trismus.  No cervical lymphadenopathy Cardiovascular:     Rate and Rhythm: Normal rate.     Pulses: Normal pulses.     Heart sounds: Normal  heart sounds.  Pulmonary:     Effort: Pulmonary effort is normal. No respiratory distress.     Breath sounds: Normal breath sounds and air entry.     Comments: Clear to auscultation bilaterally without wheeze, rhonchi or rales.  No evidence of accessory muscle usage. Abdominal:     General: There is no distension.     Comments: Soft, nontender without rebound or guarding.  Musculoskeletal: Normal range of motion.     Comments: Moves all extremities without difficulty.  Lymphadenopathy:     Cervical: No cervical adenopathy.  Skin:    General: Skin is warm and dry.  Neurological:     Mental Status: She is alert.      ED Treatments / Results  Labs (all labs ordered are listed, but only abnormal  results are displayed) Labs Reviewed - No data to display  EKG None  Radiology No results found.  Procedures Procedures (including critical care time)  Medications Ordered in ED Medications - No data to display   Initial Impression / Assessment and Plan / ED Course  I have reviewed the triage vital signs and the nursing notes.  Pertinent labs & imaging results that were available during my care of the patient were reviewed by me and considered in my medical decision making (see chart for details).  43 year old female who appears otherwise well presents for evaluation of flulike symptoms.  Afebrile, nonseptic, non-ill-appearing.  Lungs clear to auscultation bilaterally without wheeze, rhonchi or rales.  Oxygen saturation 99% on room air with good waveform.  No signs of acute respiratory distress.  Able to tolerate p.o. intake in department that difficulty.  Patient states her son was recently diagnosed with influenza B yesterday.  She did not receive her influenza vaccine. Of note, patient is HIV positive.  She is up-to-date on her medications and takes them regularly.  Has not skipped any doses. Patient with symptoms consistent with influenza. No signs of dehydration, tolerating PO's.  Due to patient's presentation and physical exam a chest x-ray was not ordered bc likely diagnosis of flu. Patient is hemodynamically stable and appropriate for DC home at this time.  Given patient's immunocompromise status will DC home with Tamiflu.  Discussed follow-up with PCP reevaluation the next 1 to 2 days.  Discussed return precautions.  Patient voiced understanding and is agreeable for follow-up.    Final Clinical Impressions(s) / ED Diagnoses   Final diagnoses:  Flu-like symptoms    ED Discharge Orders         Ordered    oseltamivir (TAMIFLU) 75 MG capsule  Every 12 hours     09/03/18 1509           Rhianne Soman A, PA-C 09/03/18 1558    Jacalyn LefevreHaviland, Julie, MD 09/04/18 [phone]

## 2018-09-03 NOTE — Discharge Instructions (Addendum)
Evaluated today for flulike symptoms.  I prescribed you Tamiflu.  If you develop nausea, vomiting, severe headache or diarrhea please stop taking this medication.  Please follow-up with your PCP for reevaluation the next 1 to 2 days.  Return to the ED for any worsening symptoms.

## 2020-04-09 ENCOUNTER — Other Ambulatory Visit: Payer: Self-pay

## 2020-04-09 ENCOUNTER — Encounter (HOSPITAL_BASED_OUTPATIENT_CLINIC_OR_DEPARTMENT_OTHER): Payer: Self-pay | Admitting: *Deleted

## 2020-04-09 ENCOUNTER — Emergency Department (HOSPITAL_BASED_OUTPATIENT_CLINIC_OR_DEPARTMENT_OTHER)
Admission: EM | Admit: 2020-04-09 | Discharge: 2020-04-09 | Disposition: A | Discharge status: Home / Self Care | Payer: Medicaid Other | Attending: Emergency Medicine | Admitting: Emergency Medicine

## 2020-04-09 DIAGNOSIS — Z21 Asymptomatic human immunodeficiency virus [HIV] infection status: Secondary | ICD-10-CM | POA: Diagnosis not present

## 2020-04-09 DIAGNOSIS — R430 Anosmia: Secondary | ICD-10-CM | POA: Diagnosis not present

## 2020-04-09 DIAGNOSIS — U071 COVID-19: Secondary | ICD-10-CM | POA: Insufficient documentation

## 2020-04-09 DIAGNOSIS — Z20822 Contact with and (suspected) exposure to covid-19: Secondary | ICD-10-CM

## 2020-04-09 DIAGNOSIS — Z1152 Encounter for screening for COVID-19: Secondary | ICD-10-CM | POA: Diagnosis present

## 2020-04-09 LAB — SARS CORONAVIRUS 2 BY RT PCR (HOSPITAL ORDER, PERFORMED IN ~~LOC~~ HOSPITAL LAB): SARS Coronavirus 2: POSITIVE — AB

## 2020-04-09 NOTE — ED Triage Notes (Addendum)
Pt c/o loss of smell. Pt with slight cough. Denies fevers. Denies sob. Exposed to her sister who is covid positive. Pt is hiv positive.

## 2020-04-09 NOTE — ED Notes (Signed)
ED Provider at bedside. 

## 2020-04-09 NOTE — ED Provider Notes (Signed)
[facility] EMERGENCY DEPARTMENT Provider Note   CSN: [id number] Arrival date & time: 04/09/20  9357     History Chief Complaint  Patient presents with  . covid exposure    Brandy Patton is a 44 y.o. female.   Illness Quality:  Exposure to covid Severity:  Mild Onset quality:  Gradual Timing:  Constant Progression:  Unchanged Chronicity:  New Context:  Also has anosmia Relieved by:  Nothing Worsened by:  Nothing Ineffective treatments:  None tried Associated symptoms: no chest pain, no congestion, no cough, no diarrhea, no fever, no headaches, no nausea, no rash, no rhinorrhea, no shortness of breath and no vomiting        Past Medical History:  Diagnosis Date  . GERD (gastroesophageal reflux disease)   . HIV (human immunodeficiency virus infection) (HCC)     There are no problems to display for this patient.   Past Surgical History:  Procedure Laterality Date  . CESAREAN SECTION    . OOPHORECTOMY    . OVARIAN CYST SURGERY       OB History   No obstetric history on file.     No family history on file.  Social History   Tobacco Use  . Smoking status: Never Smoker  Substance Use Topics  . Alcohol use: No  . Drug use: No    Home Medications Prior to Admission medications   Medication Sig Start Date End Date Taking? Authorizing Provider  AMITRIPTYLINE HCL PO Take by mouth.    [provider]  atazanavir (REYATAZ) 300 MG capsule Take 300 mg by mouth daily with breakfast.    [provider]  dicyclomine (BENTYL) 20 MG tablet Take 1 tablet (20 mg total) by mouth 2 (two) times daily. 04/19/14   Palumbo, April, MD  Doxylamine-Pyridoxine (DICLEGIS) 10-10 MG TBEC Take 2 tablets by mouth at bedtime. Take on an empty stomach 08/27/17   Palumbo, April, MD  Emtricitabine-Tenofovir (TRUVADA PO) Take by mouth.    [provider]  nitrofurantoin, macrocrystal-monohydrate, (MACROBID) 100 MG capsule Take 1 capsule (100 mg total) by  mouth 2 (two) times daily. X 7 days 08/27/17   Palumbo, April, MD  OMEPRAZOLE PO Take by mouth.    [provider]  oseltamivir (TAMIFLU) 75 MG capsule Take 1 capsule (75 mg total) by mouth every 12 (twelve) hours. 09/03/18   Henderly, Britni A, PA-C  raltegravir (ISENTRESS) 400 MG tablet Take 400 mg by mouth 2 (two) times daily.    [provider]  ritonavir (NORVIR) 100 MG TABS tablet Take 100 mg by mouth.    [provider]  SERTRALINE HCL PO Take by mouth.    [provider]    Allergies    Patient has no known allergies.  Review of Systems   Review of Systems  Constitutional: Negative for chills and fever.  HENT: Negative for congestion and rhinorrhea.        Lack of smell   Respiratory: Negative for cough and shortness of breath.   Cardiovascular: Negative for chest pain and palpitations.  Gastrointestinal: Negative for diarrhea, nausea and vomiting.  Genitourinary: Negative for difficulty urinating and dysuria.  Musculoskeletal: Negative for arthralgias and back pain.  Skin: Negative for rash and wound.  Neurological: Negative for light-headedness and headaches.    Physical Exam Updated Vital Signs BP (!) 142/86 (BP Location: Right Arm)   Pulse 100   Temp 98.4 F (36.9 C) (Oral)   Resp 20   Ht 5\' 2"  (  1.575 m)   Wt 108 kg   LMP 04/09/2020 (Exact Date)   SpO2 99%   BMI 43.55 kg/m   Physical Exam Vitals and nursing note reviewed. Exam conducted with a chaperone present.  Constitutional:      General: She is not in acute distress.    Appearance: Normal appearance.  HENT:     Head: Normocephalic and atraumatic.     Nose: No rhinorrhea.  Eyes:     General:        Right eye: No discharge.        Left eye: No discharge.     Conjunctiva/sclera: Conjunctivae normal.  Cardiovascular:     Rate and Rhythm: Normal rate and regular rhythm.  Pulmonary:     Effort: Pulmonary effort is normal. No respiratory distress.     Breath sounds: No  stridor.  Abdominal:     General: Abdomen is flat. There is no distension.     Palpations: Abdomen is soft.  Musculoskeletal:        General: No tenderness or signs of injury.  Skin:    General: Skin is warm and dry.  Neurological:     General: No focal deficit present.     Mental Status: She is alert. Mental status is at baseline.     Motor: No weakness.  Psychiatric:        Mood and Affect: Mood normal.        Behavior: Behavior normal.     ED Results / Procedures / Treatments   Labs (all labs ordered are listed, but only abnormal results are displayed) Labs Reviewed  SARS CORONAVIRUS 2 BY RT PCR (HOSPITAL ORDER, PERFORMED IN [facility])    EKG None  Radiology No results found.  Procedures Procedures (including critical care time)  Medications Ordered in ED Medications - No data to display  ED Course  I have reviewed the triage vital signs and the nursing notes.  Pertinent labs & imaging results that were available during my care of the patient were reviewed by me and considered in my medical decision making (see chart for details).    MDM Rules/Calculators/A&P                          Covid exposure, only main symptom is lack of smell.  Otherwise well-appearing, normal work of breathing well-hydrated.  No other complaints.  Covid testing pending he will be sent home and to follow-up online.  Strict return precautions given.  Outpatient follow-up recommended.   Final Clinical Impression(s) / ED Diagnoses Final diagnoses:  Anosmia  Close exposure to COVID-19 virus    Rx / DC Orders ED Discharge Orders    None       Sabino Donovan, MD 04/09/20 [phone]

## 2020-04-10 ENCOUNTER — Telehealth: Payer: Self-pay | Admitting: Adult Health

## 2020-04-10 NOTE — Telephone Encounter (Signed)
Called to discuss with Brandy Patton about Covid symptoms and the use of Regeneron, a monoclonal antibody infusion for those with mild to moderate Covid symptoms and at a high risk of hospitalization.     Pt is qualified for this infusion at infusion center due to co-morbid conditions and/or a member of an at-risk group, however declines infusion at this time. Symptoms tier reviewed as well as criteria for ending isolation.  Symptoms reviewed that would warrant ED/Hospital evaluation. Preventative practices reviewed. Patient verbalized understanding. Patient advised to call back if he decides that he does want to get infusion. Callback number to the infusion center given. Patient advised to go to Urgent care or ED with severe symptoms.   Brandy Anes, NP 04/10/20 8:20 PM Medical Oncology and Hematology [facility] [address] [place], [place] [address] Tel. [phone]    Fax. [phone]

## 2020-06-12 ENCOUNTER — Other Ambulatory Visit: Payer: Self-pay

## 2020-06-12 ENCOUNTER — Encounter (HOSPITAL_BASED_OUTPATIENT_CLINIC_OR_DEPARTMENT_OTHER): Payer: Self-pay | Admitting: *Deleted

## 2020-06-12 ENCOUNTER — Emergency Department (HOSPITAL_BASED_OUTPATIENT_CLINIC_OR_DEPARTMENT_OTHER)
Admission: EM | Admit: 2020-06-12 | Discharge: 2020-06-12 | Disposition: A | Discharge status: Home / Self Care | Payer: Medicaid Other | Attending: Emergency Medicine | Admitting: Emergency Medicine

## 2020-06-12 DIAGNOSIS — K219 Gastro-esophageal reflux disease without esophagitis: Secondary | ICD-10-CM | POA: Insufficient documentation

## 2020-06-12 DIAGNOSIS — R109 Unspecified abdominal pain: Secondary | ICD-10-CM | POA: Insufficient documentation

## 2020-06-12 DIAGNOSIS — R197 Diarrhea, unspecified: Secondary | ICD-10-CM | POA: Insufficient documentation

## 2020-06-12 DIAGNOSIS — B349 Viral infection, unspecified: Secondary | ICD-10-CM | POA: Insufficient documentation

## 2020-06-12 LAB — COMPREHENSIVE METABOLIC PANEL
ALT: 18 U/L (ref 0–44)
AST: 20 U/L (ref 15–41)
Albumin: 3.8 g/dL (ref 3.5–5.0)
Alkaline Phosphatase: 41 U/L (ref 38–126)
Anion gap: 9 (ref 5–15)
BUN: 6 mg/dL (ref 6–20)
CO2: 23 mmol/L (ref 22–32)
Calcium: 8.9 mg/dL (ref 8.9–10.3)
Chloride: 102 mmol/L (ref 98–111)
Creatinine, Ser: 0.67 mg/dL (ref 0.44–1.00)
GFR, Estimated: >60 mL/min (ref >60)
Glucose, Bld: 115 mg/dL — ABNORMAL HIGH (ref 70–99)
Potassium: 3.5 mmol/L (ref 3.5–5.1)
Sodium: 134 mmol/L — ABNORMAL LOW (ref 135–145)
Total Bilirubin: 0.3 mg/dL (ref 0.3–1.2)
Total Protein: 8.8 g/dL — ABNORMAL HIGH (ref 6.5–8.1)

## 2020-06-12 LAB — CBC
HCT: 36.4 % (ref 36.0–46.0)
Hemoglobin: 12.1 g/dL (ref 12.0–15.0)
MCH: 28.5 pg (ref 26.0–34.0)
MCHC: 33.2 g/dL (ref 30.0–36.0)
MCV: 85.8 fL (ref 80.0–100.0)
Platelets: 193 10*3/uL (ref 150–400)
RBC: 4.24 MIL/uL (ref 3.87–5.11)
RDW: 12.5 % (ref 11.5–15.5)
WBC: 6.8 10*3/uL (ref 4.0–10.5)
nRBC: 0 % (ref 0.0–0.2)

## 2020-06-12 LAB — URINALYSIS, ROUTINE W REFLEX MICROSCOPIC
Bilirubin Urine: NEGATIVE
Glucose, UA: NEGATIVE mg/dL
Ketones, ur: NEGATIVE mg/dL
Leukocytes,Ua: NEGATIVE
Nitrite: NEGATIVE
Protein, ur: NEGATIVE mg/dL
Specific Gravity, Urine: 1.015 (ref 1.005–1.030)
pH: 6 (ref 5.0–8.0)

## 2020-06-12 LAB — URINALYSIS, MICROSCOPIC (REFLEX)

## 2020-06-12 LAB — LIPASE, BLOOD: Lipase: 33 U/L (ref 11–51)

## 2020-06-12 LAB — PREGNANCY, URINE: Preg Test, Ur: NEGATIVE

## 2020-06-12 MED ORDER — ONDANSETRON 4 MG PO TBDP: 4.0000 mg | ORAL_TABLET | Freq: Three times a day (TID) | ORAL | 0 refills | Status: AC | PRN

## 2020-06-12 NOTE — ED Provider Notes (Signed)
[facility] EMERGENCY DEPARTMENT Provider Note   CSN: [id number] Arrival date & time: 06/12/20  1503     History Chief Complaint  Patient presents with   Abdominal Pain   Emesis   Diarrhea    Brandy Patton is a 44 y.o. female.  Past medical history gastric reflux disease, HIV on antiretroviral therapy presents to ER with concern for abdominal pain, nausea, diarrhea.  Patient reports she has been having symptoms over the last few days, relatively stable, no significant changes today.  Mostly nausea is relatively mild, no vomiting today.  She has had some mild intermittent abdominal pain, crampy, middle of abdomen.  No ongoing pain at this time.  Also has had a couple loose stools, nonbloody.  No fevers.  She reports having COVID-19 in August, reports relatively mild disease, no known complications.  HPI     Past Medical History:  Diagnosis Date   GERD (gastroesophageal reflux disease)    HIV (human immunodeficiency virus infection) (HCC)     There are no problems to display for this patient.   Past Surgical History:  Procedure Laterality Date   CESAREAN SECTION     OOPHORECTOMY     OVARIAN CYST SURGERY       OB History   No obstetric history on file.     No family history on file.  Social History   Tobacco Use   Smoking status: Never Smoker   Smokeless tobacco: Never Used  Substance Use Topics   Alcohol use: No   Drug use: No    Home Medications Prior to Admission medications   Medication Sig Start Date End Date Taking? Authorizing Provider  AMITRIPTYLINE HCL PO Take by mouth.    [provider]  atazanavir (REYATAZ) 300 MG capsule Take 300 mg by mouth daily with breakfast.    [provider]  dicyclomine (BENTYL) 20 MG tablet Take 1 tablet (20 mg total) by mouth 2 (two) times daily. 04/19/14   Palumbo, April, MD  Doxylamine-Pyridoxine (DICLEGIS) 10-10 MG TBEC Take 2 tablets by mouth at bedtime. Take on an empty  stomach 08/27/17   Palumbo, April, MD  Emtricitabine-Tenofovir (TRUVADA PO) Take by mouth.    [provider]  nitrofurantoin, macrocrystal-monohydrate, (MACROBID) 100 MG capsule Take 1 capsule (100 mg total) by mouth 2 (two) times daily. X 7 days 08/27/17   Palumbo, April, MD  OMEPRAZOLE PO Take by mouth.    [provider]  ondansetron (ZOFRAN ODT) 4 MG disintegrating tablet Take 1 tablet (4 mg total) by mouth every 8 (eight) hours as needed for nausea or vomiting. 06/12/20   Milagros Loll, MD  oseltamivir (TAMIFLU) 75 MG capsule Take 1 capsule (75 mg total) by mouth every 12 (twelve) hours. 09/03/18   Henderly, Britni A, PA-C  raltegravir (ISENTRESS) 400 MG tablet Take 400 mg by mouth 2 (two) times daily.    [provider]  ritonavir (NORVIR) 100 MG TABS tablet Take 100 mg by mouth.    [provider]  SERTRALINE HCL PO Take by mouth.    [provider]    Allergies    Patient has no known allergies.  Review of Systems   Review of Systems  Constitutional: Positive for chills. Negative for fever.  HENT: Negative for ear pain and sore throat.   Eyes: Negative for pain and visual disturbance.  Respiratory: Negative for cough and shortness of breath.   Cardiovascular: Negative for chest pain and palpitations.  Gastrointestinal: Positive for  abdominal pain, diarrhea, nausea and vomiting.  Genitourinary: Negative for dysuria and hematuria.  Musculoskeletal: Negative for arthralgias and back pain.  Skin: Negative for color change and rash.  Neurological: Negative for seizures and syncope.  All other systems reviewed and are negative.   Physical Exam Updated Vital Signs BP 134/88    Pulse 85    Temp 98.4 F (36.9 C) (Oral)    Resp 20    Ht 5\' 2"  (1.575 m)    Wt 106 kg    LMP 06/05/2020    SpO2 99%    BMI 42.74 kg/m   Physical Exam Vitals and nursing note reviewed.  Constitutional:      General: She is not in acute distress.    Appearance:  She is well-developed.  HENT:     Head: Normocephalic and atraumatic.  Eyes:     Conjunctiva/sclera: Conjunctivae normal.  Cardiovascular:     Rate and Rhythm: Normal rate and regular rhythm.     Heart sounds: No murmur heard.   Pulmonary:     Effort: Pulmonary effort is normal. No respiratory distress.     Breath sounds: Normal breath sounds.  Abdominal:     Palpations: Abdomen is soft.     Tenderness: There is no abdominal tenderness.     Comments: No tenderness throughout careful palpation of abdomen  Musculoskeletal:     Cervical back: Neck supple.  Skin:    General: Skin is warm and dry.     Capillary Refill: Capillary refill takes less than 2 seconds.  Neurological:     Mental Status: She is alert.     ED Results / Procedures / Treatments   Labs (all labs ordered are listed, but only abnormal results are displayed) Labs Reviewed  COMPREHENSIVE METABOLIC PANEL - Abnormal; Notable for the following components:      Result Value   Sodium 134 (*)    Glucose, Bld 115 (*)    Total Protein 8.8 (*)    All other components within normal limits  URINALYSIS, ROUTINE W REFLEX MICROSCOPIC - Abnormal; Notable for the following components:   Hgb urine dipstick SMALL (*)    All other components within normal limits  URINALYSIS, MICROSCOPIC (REFLEX) - Abnormal; Notable for the following components:   Bacteria, UA FEW (*)    All other components within normal limits  LIPASE, BLOOD  CBC  PREGNANCY, URINE    EKG None  Radiology No results found.  Procedures Procedures (including critical care time)  Medications Ordered in ED Medications - No data to display  ED Course  I have reviewed the triage vital signs and the nursing notes.  Pertinent labs & imaging results that were available during my care of the patient were reviewed by me and considered in my medical decision making (see chart for details).    MDM Rules/Calculators/A&P                         44 year old  lady presents to ER with concern for nausea, vomiting, diarrhea, abdominal pain.  On physical exam, patient noted to be remarkably well-appearing with normal vital signs.  No tenderness appreciated on her exam.  Her labs were all grossly within normal limits.  Suspect acute viral syndrome.  Given her very reassuring exam, ability to tolerate PO without difficulty, believe pt appropriate for out pt management and does not need additional work up today.  Recommend close recheck with your primary doctor.   After  the discussed management above, the patient was determined to be safe for discharge.  The patient was in agreement with this plan and all questions regarding their care were answered.  ED return precautions were discussed and the patient will return to the ED with any significant worsening of condition.  Final Clinical Impression(s) / ED Diagnoses Final diagnoses:  Diarrhea, unspecified type  Acute viral syndrome    Rx / DC Orders ED Discharge Orders         Ordered    ondansetron (ZOFRAN ODT) 4 MG disintegrating tablet  Every 8 hours PRN        06/12/20 1859           Milagros Loll, MD 06/13/20 1513

## 2020-06-12 NOTE — ED Triage Notes (Signed)
Abdominal pain, vomiting and diarrhea for a week.  

## 2020-06-12 NOTE — Discharge Instructions (Addendum)
Recommend taking Zofran as needed for nausea.  Recommend follow-up with your primary doctor regarding your symptoms.  If you develop worsening vomiting, fever, abdominal pain or other new concerning symptom, return to ER for reassessment.

## 2022-08-01 ENCOUNTER — Emergency Department (HOSPITAL_BASED_OUTPATIENT_CLINIC_OR_DEPARTMENT_OTHER): Payer: Medicaid Other

## 2022-08-01 ENCOUNTER — Other Ambulatory Visit: Payer: Self-pay

## 2022-08-01 ENCOUNTER — Emergency Department (HOSPITAL_BASED_OUTPATIENT_CLINIC_OR_DEPARTMENT_OTHER)
Admission: EM | Admit: 2022-08-01 | Discharge: 2022-08-01 | Disposition: A | Discharge status: Home / Self Care | Payer: Medicaid Other | Attending: Emergency Medicine | Admitting: Emergency Medicine

## 2022-08-01 ENCOUNTER — Encounter (HOSPITAL_BASED_OUTPATIENT_CLINIC_OR_DEPARTMENT_OTHER): Payer: Self-pay | Admitting: Urology

## 2022-08-01 DIAGNOSIS — Z21 Asymptomatic human immunodeficiency virus [HIV] infection status: Secondary | ICD-10-CM | POA: Diagnosis not present

## 2022-08-01 DIAGNOSIS — N83201 Unspecified ovarian cyst, right side: Secondary | ICD-10-CM | POA: Insufficient documentation

## 2022-08-01 DIAGNOSIS — R1031 Right lower quadrant pain: Secondary | ICD-10-CM | POA: Diagnosis present

## 2022-08-01 LAB — URINALYSIS, ROUTINE W REFLEX MICROSCOPIC
Bilirubin Urine: NEGATIVE
Glucose, UA: NEGATIVE mg/dL
Ketones, ur: NEGATIVE mg/dL
Leukocytes,Ua: NEGATIVE
Nitrite: NEGATIVE
Protein, ur: NEGATIVE mg/dL
Specific Gravity, Urine: 1.015 (ref 1.005–1.030)
pH: 6 (ref 5.0–8.0)

## 2022-08-01 LAB — URINALYSIS, MICROSCOPIC (REFLEX): WBC, UA: NONE SEEN WBC/hpf (ref 0–5)

## 2022-08-01 LAB — PREGNANCY, URINE: Preg Test, Ur: NEGATIVE

## 2022-08-01 MED ORDER — ACETAMINOPHEN 325 MG PO TABS
650.0000 mg | ORAL_TABLET | Freq: Once | ORAL | Status: AC
  Administered 2022-08-01: 650 mg via ORAL
  Filled 2022-08-01: qty 2

## 2022-08-01 MED ORDER — IBUPROFEN 400 MG PO TABS
600.0000 mg | ORAL_TABLET | Freq: Once | ORAL | Status: AC
  Administered 2022-08-01: 600 mg via ORAL
  Filled 2022-08-01: qty 1

## 2022-08-01 NOTE — ED Triage Notes (Signed)
RLQ pain that started 1 week ago but worsening today, reports burning with urination  Denies N/V/D  Denies fever

## 2022-08-01 NOTE — Discharge Instructions (Signed)
You were seen in the emergency department for your abdominal pain.  Your ultrasound shows that you have a hemorrhagic cyst on your right ovary which can commonly occur around your menstrual cycles.  These cysts can bleed and rupture that usually cause pain.  You can continue to take Tylenol and Motrin as needed for pain and both can be taken up to every 6 hours.  You should follow-up with your primary doctor or your OB/GYN within the next few days to have your symptoms rechecked.  You should return to the emergency department if you are having significantly worsening pain, fevers, repetitive vomiting or if you have any other new or concerning symptoms.

## 2022-08-01 NOTE — ED Provider Notes (Signed)
[facility] EMERGENCY DEPARTMENT Provider Note   CSN: [id number] Arrival date & time: 08/01/22  1829     History  Chief Complaint  Patient presents with   Abdominal Pain    Brandy Patton is a 46 y.o. female.  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  Patient states that she has always had some on and off right lower quadrant abdominal pain that she says she feels is from her ovary.  She states that today her pain got significantly more severe.  She denies any nausea or vomiting, diarrhea or constipation.  She denies any dysuria but reports feeling some suprapubic pressure when she is urinating.  She denies any hematuria, abnormal vaginal bleeding or discharge.  The history is provided by the patient.  Abdominal Pain      Home Medications Prior to Admission medications   Medication Sig Start Date End Date Taking? Authorizing Provider  AMITRIPTYLINE HCL PO Take by mouth.    [provider]  atazanavir (REYATAZ) 300 MG capsule Take 300 mg by mouth daily with breakfast.    [provider]  dicyclomine (BENTYL) 20 MG tablet Take 1 tablet (20 mg total) by mouth 2 (two) times daily. 04/19/14   Palumbo, April, MD  Doxylamine-Pyridoxine (DICLEGIS) 10-10 MG TBEC Take 2 tablets by mouth at bedtime. Take on an empty stomach 08/27/17   Palumbo, April, MD  Emtricitabine-Tenofovir (TRUVADA PO) Take by mouth.    [provider]  nitrofurantoin, macrocrystal-monohydrate, (MACROBID) 100 MG capsule Take 1 capsule (100 mg total) by mouth 2 (two) times daily. X 7 days 08/27/17   Palumbo, April, MD  OMEPRAZOLE PO Take by mouth.    [provider]  ondansetron (ZOFRAN ODT) 4 MG disintegrating tablet Take 1 tablet (4 mg total) by mouth every 8 (eight) hours as needed for nausea or  vomiting. 06/12/20   Milagros Loll, MD  oseltamivir (TAMIFLU) 75 MG capsule Take 1 capsule (75 mg total) by mouth every 12 (twelve) hours. 09/03/18   Henderly, Britni A, PA-C  raltegravir (ISENTRESS) 400 MG tablet Take 400 mg by mouth 2 (two) times daily.    [provider]  ritonavir (NORVIR) 100 MG TABS tablet Take 100 mg by mouth.    [provider]  SERTRALINE HCL PO Take by mouth.    [provider]      Allergies    Patient has no known allergies.    Review of Systems   Review of Systems  Gastrointestinal:  Positive for abdominal pain.    Physical Exam Updated Vital Signs BP 121/82   Pulse 68   Temp 98 F (36.7 C) (Oral)   Resp 18   Ht 5\' 2"  (1.575 m)   Wt 106 kg   LMP 07/30/2022 (Approximate)   SpO2 100%   BMI 42.74 kg/m  Physical Exam Vitals and nursing note reviewed.  Constitutional:      General: She is not in acute distress.    Appearance: She is well-developed.  HENT:     Head: Normocephalic and atraumatic.     Mouth/Throat:     Mouth: Mucous membranes are moist.     Pharynx: Oropharynx is clear.  Eyes:     Extraocular Movements: Extraocular movements intact.  Cardiovascular:     Rate and Rhythm: Normal rate  and regular rhythm.     Heart sounds: Normal heart sounds.  Pulmonary:     Effort: Pulmonary effort is normal.     Breath sounds: Normal breath sounds.  Abdominal:     General: Abdomen is flat.     Palpations: Abdomen is soft.     Tenderness: There is abdominal tenderness in the right lower quadrant and suprapubic area. There is no right CVA tenderness, left CVA tenderness, guarding or rebound.     Hernia: No hernia is present.  Skin:    General: Skin is warm and dry.  Neurological:     General: No focal deficit present.     Mental Status: She is alert and oriented to person, place, and time.  Psychiatric:        Mood and Affect: Mood normal.        Behavior: Behavior normal.     ED Results / Procedures /  Treatments   Labs (all labs ordered are listed, but only abnormal results are displayed) Labs Reviewed  URINALYSIS, ROUTINE W REFLEX MICROSCOPIC - Abnormal; Notable for the following components:      Result Value   Hgb urine dipstick TRACE (*)    All other components within normal limits  URINALYSIS, MICROSCOPIC (REFLEX) - Abnormal; Notable for the following components:   Bacteria, UA RARE (*)    All other components within normal limits  PREGNANCY, URINE    EKG None  Radiology US PELVIS (TRANSABDOMINAL ONLY)  Result Date: 08/01/2022 CLINICAL DATA:  Right pelvic pain.  Prior left oophorectomy EXAM: TRANSABDOMINAL ULTRASOUND OF PELVIS DOPPLER ULTRASOUND OF OVARIES TECHNIQUE: Transabdominal ultrasound examination of the pelvis was performed including evaluation of the uterus, ovaries, adnexal regions, and pelvic cul-de-sac. Color and duplex Doppler ultrasound was utilized to evaluate blood flow to the ovaries. COMPARISON:  None Available. FINDINGS: Uterus Measurements: 11.7 x 5.5 x 6.6 cm = volume: 221 mL. No fibroids or other mass visualized. Endometrium Thickness: 11 mm in thickness.  No focal abnormality visualized. Right ovary Measurements: 6.2 x 3.3 x 5.6 cm = volume: 60 mL. Complex cyst measuring 3.5 cm with internal echoes most compatible with hemorrhagic cyst. 1.2 cm echogenic area also noted within the right ovary. Left ovary Measurements: Prior oophorectomy Pulsed Doppler evaluation demonstrates normal low-resistance arterial and venous waveforms in the right ovary. Other: No free fluid. IMPRESSION: 3.3 cm right ovarian hemorrhagic cyst.  No evidence of torsion. 1.2 cm echogenic area within the right ovary could reflect small dermoid. Electronically Signed   By: Charlett Nose M.D.   On: 08/01/2022 22:21   US PELVIC DOPPLER (TORSION R/O OR MASS ARTERIAL FLOW)  Result Date: 08/01/2022 CLINICAL DATA:  Right pelvic pain.  Prior left oophorectomy EXAM: TRANSABDOMINAL ULTRASOUND OF PELVIS  DOPPLER ULTRASOUND OF OVARIES TECHNIQUE: Transabdominal ultrasound examination of the pelvis was performed including evaluation of the uterus, ovaries, adnexal regions, and pelvic cul-de-sac. Color and duplex Doppler ultrasound was utilized to evaluate blood flow to the ovaries. COMPARISON:  None Available. FINDINGS: Uterus Measurements: 11.7 x 5.5 x 6.6 cm = volume: 221 mL. No fibroids or other mass visualized. Endometrium Thickness: 11 mm in thickness.  No focal abnormality visualized. Right ovary Measurements: 6.2 x 3.3 x 5.6 cm = volume: 60 mL. Complex cyst measuring 3.5 cm with internal echoes most compatible with hemorrhagic cyst. 1.2 cm echogenic area also noted within the right ovary. Left ovary Measurements: Prior oophorectomy Pulsed Doppler evaluation demonstrates normal low-resistance arterial and venous waveforms in the right ovary. Other:  No free fluid. IMPRESSION: 3.3 cm right ovarian hemorrhagic cyst.  No evidence of torsion. 1.2 cm echogenic area within the right ovary could reflect small dermoid. Electronically Signed   By: Charlett Nose M.D.   On: 08/01/2022 22:21    Procedures Procedures    Medications Ordered in ED Medications  ibuprofen (ADVIL) tablet 600 mg (600 mg Oral Given 08/01/22 2140)  acetaminophen (TYLENOL) tablet 650 mg (650 mg Oral Given 08/01/22 2140)    ED Course/ Medical Decision Making/ A&P                           Medical Decision Making This patient presents to the ED with chief complaint(s) of RLQ pain with pertinent past medical history of HIV on HAART, prior dermoid cyst s/p resection and L ovary oophorectomy which further complicates the presenting complaint. The complaint involves an extensive differential diagnosis and also carries with it a high risk of complications and morbidity.    The differential diagnosis includes ovarian torsion, UTI, nephrolithiasis or pyelonephritis less likely as she has no flank tenderness, considering possible appendicitis  though no fever, nausea or vomiting, she denies any STI symptoms and is nontoxic-appearing making PID or TOA less likely  Additional history obtained: Additional history obtained from N/A Records reviewed Primary Care Documents  ED Course and Reassessment: Upon patient's arrival to the emergency department she is awake and alert and mildly uncomfortable appearing though in no acute distress.  Due to patient's history of previous cyst with her right lower quadrant pain she will have a pelvic ultrasound performed to evaluate for ovarian torsion.  Urine will be performed to evaluate for UTI, pregnancy or ectopic.  She was given Tylenol and Motrin for pain and will be reassessed.  Independent labs interpretation:  The following labs were independently interpreted: UA negative for UTI  Independent visualization of imaging: - I independently visualized the following imaging with scope of interpretation limited to determining acute life threatening conditions related to emergency care: Pelvic ultrasound, which revealed right-sided hemorrhagic cyst  Consultation: - Consulted or discussed management/test interpretation w/ external professional: N/A  Consideration for admission or further workup: Patient has no emergent conditions requiring admission or further work-up at this time and is stable for discharge home with primary care or OBGYN follow-up  Social Determinants of health: N/A    Amount and/or Complexity of Data Reviewed Labs: ordered. Radiology: ordered.  Risk OTC drugs.          Final Clinical Impression(s) / ED Diagnoses Final diagnoses:  Hemorrhagic cyst of right ovary    Rx / DC Orders ED Discharge Orders     None         Rexford Maus, DO 08/01/22 2320

## 2022-08-01 NOTE — ED Notes (Signed)
Pt unable to urinate at this time, urine spec cup given while waiting

## 2023-04-04 ENCOUNTER — Emergency Department (HOSPITAL_BASED_OUTPATIENT_CLINIC_OR_DEPARTMENT_OTHER): Payer: Medicaid Other

## 2023-04-04 ENCOUNTER — Other Ambulatory Visit: Payer: Self-pay

## 2023-04-04 ENCOUNTER — Encounter (HOSPITAL_BASED_OUTPATIENT_CLINIC_OR_DEPARTMENT_OTHER): Payer: Self-pay | Admitting: Urology

## 2023-04-04 ENCOUNTER — Emergency Department (HOSPITAL_BASED_OUTPATIENT_CLINIC_OR_DEPARTMENT_OTHER)
Admission: EM | Admit: 2023-04-04 | Discharge: 2023-04-04 | Disposition: A | Discharge status: Home / Self Care | Payer: Medicaid Other | Attending: Emergency Medicine | Admitting: Emergency Medicine

## 2023-04-04 DIAGNOSIS — Z79899 Other long term (current) drug therapy: Secondary | ICD-10-CM | POA: Diagnosis not present

## 2023-04-04 DIAGNOSIS — Z21 Asymptomatic human immunodeficiency virus [HIV] infection status: Secondary | ICD-10-CM | POA: Insufficient documentation

## 2023-04-04 DIAGNOSIS — M5137 Other intervertebral disc degeneration, lumbosacral region: Secondary | ICD-10-CM | POA: Diagnosis not present

## 2023-04-04 DIAGNOSIS — M549 Dorsalgia, unspecified: Secondary | ICD-10-CM

## 2023-04-04 DIAGNOSIS — M545 Low back pain, unspecified: Secondary | ICD-10-CM | POA: Diagnosis present

## 2023-04-04 LAB — COMPREHENSIVE METABOLIC PANEL
ALT: 22 U/L (ref 0–44)
AST: 19 U/L (ref 15–41)
Albumin: 3.6 g/dL (ref 3.5–5.0)
Alkaline Phosphatase: 36 U/L — ABNORMAL LOW (ref 38–126)
Anion gap: 6 (ref 5–15)
BUN: <5 mg/dL — ABNORMAL LOW (ref 6–20)
CO2: 23 mmol/L (ref 22–32)
Calcium: 8.4 mg/dL — ABNORMAL LOW (ref 8.9–10.3)
Chloride: 106 mmol/L (ref 98–111)
Creatinine, Ser: 0.53 mg/dL (ref 0.44–1.00)
GFR, Estimated: >60 mL/min (ref >60)
Glucose, Bld: 98 mg/dL (ref 70–99)
Potassium: 3.7 mmol/L (ref 3.5–5.1)
Sodium: 135 mmol/L (ref 135–145)
Total Bilirubin: 0.4 mg/dL (ref 0.3–1.2)
Total Protein: 8 g/dL (ref 6.5–8.1)

## 2023-04-04 LAB — URINALYSIS, ROUTINE W REFLEX MICROSCOPIC
Bilirubin Urine: NEGATIVE
Glucose, UA: NEGATIVE mg/dL
Ketones, ur: NEGATIVE mg/dL
Leukocytes,Ua: NEGATIVE
Nitrite: NEGATIVE
Protein, ur: NEGATIVE mg/dL
Specific Gravity, Urine: 1.025 (ref 1.005–1.030)
pH: 5.5 (ref 5.0–8.0)

## 2023-04-04 LAB — CBC WITH DIFFERENTIAL/PLATELET
Abs Immature Granulocytes: 0.04 10*3/uL (ref 0.00–0.07)
Basophils Absolute: 0 10*3/uL (ref 0.0–0.1)
Basophils Relative: 0 %
Eosinophils Absolute: 0.1 10*3/uL (ref 0.0–0.5)
Eosinophils Relative: 1 %
HCT: 34.9 % — ABNORMAL LOW (ref 36.0–46.0)
Hemoglobin: 11 g/dL — ABNORMAL LOW (ref 12.0–15.0)
Immature Granulocytes: 1 %
Lymphocytes Relative: 44 %
Lymphs Abs: 3 10*3/uL (ref 0.7–4.0)
MCH: 25.8 pg — ABNORMAL LOW (ref 26.0–34.0)
MCHC: 31.5 g/dL (ref 30.0–36.0)
MCV: 81.7 fL (ref 80.0–100.0)
Monocytes Absolute: 0.6 10*3/uL (ref 0.1–1.0)
Monocytes Relative: 8 %
Neutro Abs: 3.1 10*3/uL (ref 1.7–7.7)
Neutrophils Relative %: 46 %
Platelets: 165 10*3/uL (ref 150–400)
RBC: 4.27 MIL/uL (ref 3.87–5.11)
RDW: 17 % — ABNORMAL HIGH (ref 11.5–15.5)
WBC: 6.8 10*3/uL (ref 4.0–10.5)
nRBC: 0 % (ref 0.0–0.2)

## 2023-04-04 LAB — URINALYSIS, MICROSCOPIC (REFLEX)

## 2023-04-04 LAB — LIPASE, BLOOD: Lipase: 34 U/L (ref 11–51)

## 2023-04-04 LAB — PREGNANCY, URINE: Preg Test, Ur: NEGATIVE

## 2023-04-04 MED ORDER — ONDANSETRON HCL 4 MG/2ML IJ SOLN
4.0000 mg | Freq: Once | INTRAMUSCULAR | Status: AC
  Administered 2023-04-04: 4 mg via INTRAVENOUS
  Filled 2023-04-04: qty 2

## 2023-04-04 MED ORDER — SODIUM CHLORIDE 0.9 % IV BOLUS
1000.0000 mL | Freq: Once | INTRAVENOUS | Status: AC
  Administered 2023-04-04: 1000 mL via INTRAVENOUS

## 2023-04-04 MED ORDER — KETOROLAC TROMETHAMINE 15 MG/ML IJ SOLN
15.0000 mg | Freq: Once | INTRAMUSCULAR | Status: AC
  Administered 2023-04-04: 15 mg via INTRAVENOUS
  Filled 2023-04-04: qty 1

## 2023-04-04 MED ORDER — LIDOCAINE 5 % EX PTCH: 1.0000 | MEDICATED_PATCH | CUTANEOUS | 0 refills | Status: AC

## 2023-04-04 MED ORDER — MORPHINE SULFATE (PF) 4 MG/ML IV SOLN
4.0000 mg | Freq: Once | INTRAVENOUS | Status: AC
  Administered 2023-04-04: 4 mg via INTRAVENOUS
  Filled 2023-04-04: qty 1

## 2023-04-04 MED ORDER — LIDOCAINE 5 % EX PTCH
1.0000 | MEDICATED_PATCH | Freq: Once | CUTANEOUS | Status: DC
  Administered 2023-04-04: 1 via TRANSDERMAL
  Filled 2023-04-04: qty 1

## 2023-04-04 NOTE — ED Provider Notes (Cosign Needed Addendum)
[place] EMERGENCY DEPARTMENT AT [facility] Provider Note   CSN: [id number] Arrival date & time: 04/04/23  1422     History  Chief Complaint  Patient presents with   Back Pain   HPI Brandy Patton is a 47 y.o. female with GERD, HIV presenting for back pain.  Started 3 days ago.  Located in the lower back.  Feels like a dull pain.  It is nonradiating.  Denies saddle anesthesia.  Denies urinary and bowel changes.  Denies any trauma to her back.  States the pain is worse with movement and bending.  States she just recently started on iron metformin and thought it might be constipation.  States she did have a normal bowel movement this morning.  Denies any urinary symptoms.   Back Pain      Home Medications Prior to Admission medications   Medication Sig Start Date End Date Taking? Authorizing Provider  lidocaine (LIDODERM) 5 % Place 1 patch onto the skin daily. Remove & Discard patch within 12 hours or as directed by MD 04/04/23  Yes Gareth Eagle, PA-C  AMITRIPTYLINE HCL PO Take by mouth.    [provider]  atazanavir (REYATAZ) 300 MG capsule Take 300 mg by mouth daily with breakfast.    [provider]  dicyclomine (BENTYL) 20 MG tablet Take 1 tablet (20 mg total) by mouth 2 (two) times daily. 04/19/14   Palumbo, April, MD  Doxylamine-Pyridoxine (DICLEGIS) 10-10 MG TBEC Take 2 tablets by mouth at bedtime. Take on an empty stomach 08/27/17   Palumbo, April, MD  Emtricitabine-Tenofovir (TRUVADA PO) Take by mouth.    [provider]  nitrofurantoin, macrocrystal-monohydrate, (MACROBID) 100 MG capsule Take 1 capsule (100 mg total) by mouth 2 (two) times daily. X 7 days 08/27/17   Palumbo, April, MD  OMEPRAZOLE PO Take by mouth.    [provider]  ondansetron (ZOFRAN ODT) 4 MG disintegrating tablet Take 1 tablet (4 mg total) by mouth every 8 (eight) hours as needed for nausea or vomiting. 06/12/20   Milagros Loll, MD  oseltamivir  (TAMIFLU) 75 MG capsule Take 1 capsule (75 mg total) by mouth every 12 (twelve) hours. 09/03/18   Henderly, Britni A, PA-C  raltegravir (ISENTRESS) 400 MG tablet Take 400 mg by mouth 2 (two) times daily.    [provider]  ritonavir (NORVIR) 100 MG TABS tablet Take 100 mg by mouth.    [provider]  SERTRALINE HCL PO Take by mouth.    [provider]      Allergies    Patient has no known allergies.    Review of Systems   Review of Systems  Musculoskeletal:  Positive for back pain.    Physical Exam Updated Vital Signs BP 124/83 (BP Location: Right Arm)   Pulse 79   Temp 98.3 F (36.8 C) (Oral)   Resp 18   Ht 5\' 2"  (1.575 m)   Wt 104.3 kg   LMP 03/19/2023   SpO2 100%   BMI 42.07 kg/m  Physical Exam Vitals and nursing note reviewed.  HENT:     Head: Normocephalic and atraumatic.     Mouth/Throat:     Mouth: Mucous membranes are moist.  Eyes:     General:        Right eye: No discharge.        Left eye: No discharge.     Conjunctiva/sclera: Conjunctivae normal.  Cardiovascular:     Rate and Rhythm: Normal  rate and regular rhythm.     Pulses: Normal pulses.     Heart sounds: Normal heart sounds.  Pulmonary:     Effort: Pulmonary effort is normal.     Breath sounds: Normal breath sounds.  Abdominal:     General: Abdomen is flat.     Palpations: Abdomen is soft.     Tenderness: There is right CVA tenderness and left CVA tenderness.  Musculoskeletal:     Lumbar back: Tenderness present. Normal range of motion.  Skin:    General: Skin is warm and dry.  Neurological:     General: No focal deficit present.  Psychiatric:        Mood and Affect: Mood normal.     ED Results / Procedures / Treatments   Labs (all labs ordered are listed, but only abnormal results are displayed) Labs Reviewed  CBC WITH DIFFERENTIAL/PLATELET - Abnormal; Notable for the following components:      Result Value   Hemoglobin 11.0 (*)    HCT 34.9 (*)    MCH  25.8 (*)    RDW 17.0 (*)    All other components within normal limits  COMPREHENSIVE METABOLIC PANEL - Abnormal; Notable for the following components:   BUN <5 (*)    Calcium 8.4 (*)    Alkaline Phosphatase 36 (*)    All other components within normal limits  URINALYSIS, ROUTINE W REFLEX MICROSCOPIC - Abnormal; Notable for the following components:   Hgb urine dipstick TRACE (*)    All other components within normal limits  URINALYSIS, MICROSCOPIC (REFLEX) - Abnormal; Notable for the following components:   Bacteria, UA FEW (*)    All other components within normal limits  LIPASE, BLOOD  PREGNANCY, URINE    EKG None  Radiology CT L-SPINE NO CHARGE  Result Date: 04/04/2023 CLINICAL DATA:  Bilateral low back pain for 3 days. EXAM: CT Lumbar Spine without contrast TECHNIQUE: Technique: Multiplanar CT images of the lumbar spine were reconstructed from contemporary CT of the Abdomen and Pelvis. RADIATION DOSE REDUCTION: This exam was performed according to the departmental dose-optimization program which includes automated exposure control, adjustment of the mA and/or kV according to patient size and/or use of iterative reconstruction technique. CONTRAST:  None COMPARISON:  Concurrent abdominopelvic CT. Previous abdominopelvic CT 05/01/2010. FINDINGS: Segmentation: There are 5 lumbar type vertebral bodies. Alignment: Normal. Vertebrae: No evidence of acute fracture or pars defect. Chronic endplate degenerative changes at L5-S1. Lower lumbar facet arthropathy and mild sacroiliac degenerative changes bilaterally. Paraspinal and other soft tissues: The paraspinal soft tissues appear unremarkable. Abdominopelvic CT findings dictated separately. Disc levels: No evidence of large disc herniation, significant spinal stenosis or high-grade foraminal narrowing. At L5-S1, there is moderate loss of disc height with vacuum phenomenon, disc bulging and endplate osteophytes asymmetric to the right. Resulting  mild right foraminal narrowing. The left foramen and spinal canal are patent. The additional lumbar disc heights are preserved. Moderate bilateral facet hypertrophy at L4-5. IMPRESSION: 1. No acute osseous findings in the lumbar spine. 2. Moderate degenerative disc disease at L5-S1 with disc bulging and endplate osteophytes asymmetric to the right. Resulting mild right foraminal narrowing. 3. Lower lumbar facet arthropathy. 4. Abdominopelvic CT findings dictated separately. Electronically Signed   By: Carey Bullocks M.D.   On: 04/04/2023 16:38   CT Renal Stone Study  Result Date: 04/04/2023 CLINICAL DATA:  Lower abdominal and flank pain for 3 days. Suspected urinary calculus. EXAM: CT ABDOMEN AND PELVIS WITHOUT CONTRAST TECHNIQUE: Multidetector CT  imaging of the abdomen and pelvis was performed following the standard protocol without IV contrast. RADIATION DOSE REDUCTION: This exam was performed according to the departmental dose-optimization program which includes automated exposure control, adjustment of the mA and/or kV according to patient size and/or use of iterative reconstruction technique. COMPARISON:  05/01/2010 FINDINGS: Lower chest: No acute findings. Hepatobiliary: No mass visualized on this unenhanced exam. Gallbladder is unremarkable. No evidence of biliary ductal dilatation. Pancreas: No mass or inflammatory process visualized on this unenhanced exam. Spleen:  Within normal limits in size. Adrenals/Urinary tract: No evidence of urolithiasis or hydronephrosis. Unremarkable unopacified urinary bladder. Stomach/Bowel: No evidence of obstruction, inflammatory process, or abnormal fluid collections. Normal appendix visualized. Extensive colonic diverticulosis is again seen, however there are no signs of diverticulitis. Vascular/Lymphatic: No pathologically enlarged lymph nodes identified. No evidence of abdominal aortic aneurysm. Reproductive:  No mass or other significant abnormality. Other:  None.  Musculoskeletal:  No suspicious bone lesions identified. IMPRESSION: No evidence of urolithiasis, hydronephrosis, or other acute findings. Colonic diverticulosis, without radiographic evidence of diverticulitis. Electronically Signed   By: Danae Orleans M.D.   On: 04/04/2023 16:30    Procedures Procedures    Medications Ordered in ED Medications  lidocaine (LIDODERM) 5 % 1 patch (1 patch Transdermal Patch Applied 04/04/23 1602)  ketorolac (TORADOL) 15 MG/ML injection 15 mg (has no administration in time range)  sodium chloride 0.9 % bolus 1,000 mL (1,000 mLs Intravenous New Bag/Given 04/04/23 1603)  morphine (PF) 4 MG/ML injection 4 mg (4 mg Intravenous Given 04/04/23 1459)  ondansetron (ZOFRAN) injection 4 mg (4 mg Intravenous Given 04/04/23 1459)    ED Course/ Medical Decision Making/ A&P                                 Medical Decision Making Amount and/or Complexity of Data Reviewed Labs: ordered. Radiology: ordered.  Risk Prescription drug management.   47 yo well-appearing female presenting for back pain.  Exam notable for left and right sided CVA tenderness.  DDx includes pyelonephritis, nephrolithiasis, radiculopathy, cauda equina, acute back injury.  Labs were unremarkable.  Initial concern was possible kidney stone.  CT scan revealed moderate disc degenerative disease at the level of L5-S1 but no other acute findings.  Discussed these findings with patient. Suspect overall back pain is of MSK etiology.  Doubt cauda equina syndrome.  Also per chart review, it appears her HIV is well-controlled per labs checked in June.  No suspicion at this time for ongoing infection. Treat her back pain with Lidoderm patches. Recommend she follow-up with her PCP.  Recommended conservative treatment at home.  Discussed pertinent return precautions.  Vital stable.  Discharged home in good condition.        Final Clinical Impression(s) / ED Diagnoses Final diagnoses:  Back pain, unspecified back  location, unspecified back pain laterality, unspecified chronicity    Rx / DC Orders ED Discharge Orders          Ordered    lidocaine (LIDODERM) 5 %  Every 24 hours        04/04/23 1656              Gareth Eagle, PA-C 04/04/23 1652    Gareth Eagle, PA-C 04/04/23 1657    Long, Arlyss Repress, MD 04/08/23 [phone]

## 2023-04-04 NOTE — ED Notes (Signed)
Patient transported to CT 

## 2023-04-04 NOTE — ED Triage Notes (Signed)
Lower back pain x 3 days  Pain worse with movement and bending  On new iron and metformin meds, and thought it was constipation but had normal BM today  Denies urinary symptoms

## 2023-04-04 NOTE — Discharge Instructions (Signed)
Evaluation today was overall reassuring.  Recommend you follow-up with your PCP for ongoing back pain.  Also recommend you follow-up with your ID provider for plans regarding your new treatment for HIV.  If you have new trauma to your back, develop any numbness or weakness in your lower extremities, have urinary or bowel  incontinence or retention or any other concern please return emergency department for further evaluation.
